# Patient Record
Sex: Male | Born: 1980 | Race: Black or African American | Hispanic: No | Marital: Single | Smoking: Current some day smoker
Health system: Southern US, Community
[De-identification: ages and names within clinical notes are randomized; demographics above are authoritative.]

## PROBLEM LIST (undated history)

## (undated) DIAGNOSIS — R12 Heartburn: Secondary | ICD-10-CM

## (undated) DIAGNOSIS — R569 Unspecified convulsions: Secondary | ICD-10-CM

## (undated) DIAGNOSIS — R011 Cardiac murmur, unspecified: Secondary | ICD-10-CM

## (undated) HISTORY — PX: HERNIA REPAIR: SHX51

## (undated) HISTORY — DX: Cardiac murmur, unspecified: R01.1

---

## 1999-03-24 ENCOUNTER — Emergency Department (HOSPITAL_COMMUNITY): Admission: EM | Admit: 1999-03-24 | Discharge: 1999-03-24 | Payer: Self-pay

## 2000-09-14 ENCOUNTER — Encounter: Payer: Self-pay | Admitting: Emergency Medicine

## 2000-09-14 ENCOUNTER — Emergency Department (HOSPITAL_COMMUNITY): Admission: EM | Admit: 2000-09-14 | Discharge: 2000-09-14 | Payer: Self-pay

## 2011-10-12 ENCOUNTER — Encounter: Payer: Self-pay | Admitting: Cardiology

## 2011-10-20 ENCOUNTER — Ambulatory Visit (INDEPENDENT_AMBULATORY_CARE_PROVIDER_SITE_OTHER): Payer: PRIVATE HEALTH INSURANCE | Admitting: Cardiology

## 2011-10-20 ENCOUNTER — Encounter: Payer: Self-pay | Admitting: Cardiology

## 2011-10-20 VITALS — BP 132/80 | HR 61 | Ht 66.0 in | Wt 135.0 lb

## 2011-10-20 DIAGNOSIS — R9431 Abnormal electrocardiogram [ECG] [EKG]: Secondary | ICD-10-CM

## 2011-10-20 NOTE — Assessment & Plan Note (Signed)
ECG chest findings are consistent with early repolarization which is a normal variant. This particularly prominent in young black males. He has no cardiac symptoms or history of cardiac disease. His exam is otherwise normal. No further evaluation is warranted in the patient is reassured.

## 2011-10-20 NOTE — Progress Notes (Signed)
   Jose Galvan Date of Birth: [birth year] Medical Record #[id number]  History of Present Illness: Jose Galvan is seen the request of his primary care physician for evaluation of abnormal ECG. He is a 31 year old [place] American male. He has been in excellent health. He denies any history of cardiac disease. He has no history of hypertension, hyperlipidemia, or diabetes. He reports a heart murmur noted as a child. He denies any chest pain, shortness of breath, palpitations, or dizziness. He tries to follow a heart healthy diet and quit smoking several years ago.  No current outpatient prescriptions on file prior to visit.    No Known Allergies  Past Medical History  Diagnosis Date  . Heart murmur     as a child    Past Surgical History  Procedure Date  . Hernia repair     at 11-42 years of age    History  Smoking status  . Former Smoker  . Types: Cigarettes  . Quit date: 10/19/2005  Smokeless tobacco  . Never Used    History  Alcohol Use: Not on file    Family History  Problem Relation Age of Onset  . Heart attack Father 36  . Coronary artery disease Father 52    with stent  . Stroke Sister 81  . Cancer Paternal Grandfather   . Cancer Maternal Grandmother   . Diabetes Mother   . Hypertension Sister   . Hypertension Sister   . Hypertension Mother   . Hypertension Sister     Review of Systems: The review of systems is positive for pain in his right elbow from a construction injury..  All other systems were reviewed and are negative.  Physical Exam: BP 132/80  Pulse 61  Ht 5\' 6"  (1.676 m)  Wt 135 lb (61.236 kg)  BMI 21.79 kg/m2 He is a pleasant, fit, black male in no acute distress. HEENT exam reveals he is normocephalic, atraumatic. Pupils are equal round and reactive. Sclera are clear. Oropharynx clear. Neck supple without JVD, adenopathy, thyromegaly, or bruits. Lungs are clear. Cardiac exam reveals a regular rate and rhythm. Normal S1 and S2. PMI is normal. No  gallop or murmur. Abdomen is soft and nontender without mass or bruits. Extremities are without edema. Pulses are 2+. Skin is warm and dry. Neurologic exam is nonfocal. LABORATORY DATA: Dated 08/12/2011 CBC is normal. Complete chemistry panel was normal. Total cholesterol 187, triglycerides 50, HDL 109, LDL 68. C-reactive protein 0.26. TSH 0.578. ECG dated 08/12/2011 and again today demonstrates normal sinus rhythm with a normal ECG with early repolarization..  Assessment / Plan:

## 2011-10-20 NOTE — Patient Instructions (Signed)
Your heart and Ecg are normal. No further work up needed.

## 2011-10-31 ENCOUNTER — Encounter: Payer: Self-pay | Admitting: Cardiology

## 2011-11-03 ENCOUNTER — Encounter: Payer: Self-pay | Admitting: Cardiology

## 2012-02-20 DIAGNOSIS — R569 Unspecified convulsions: Secondary | ICD-10-CM

## 2012-02-20 HISTORY — DX: Unspecified convulsions: R56.9

## 2012-03-20 ENCOUNTER — Emergency Department (HOSPITAL_COMMUNITY): Payer: 59

## 2012-03-20 ENCOUNTER — Other Ambulatory Visit: Payer: Self-pay

## 2012-03-20 ENCOUNTER — Emergency Department (HOSPITAL_COMMUNITY)
Admission: EM | Admit: 2012-03-20 | Discharge: 2012-03-20 | Disposition: A | Discharge status: Home / Self Care | Payer: 59 | Attending: Emergency Medicine | Admitting: Emergency Medicine

## 2012-03-20 DIAGNOSIS — R569 Unspecified convulsions: Secondary | ICD-10-CM | POA: Insufficient documentation

## 2012-03-20 DIAGNOSIS — Z87891 Personal history of nicotine dependence: Secondary | ICD-10-CM | POA: Insufficient documentation

## 2012-03-20 DIAGNOSIS — Z72820 Sleep deprivation: Secondary | ICD-10-CM | POA: Insufficient documentation

## 2012-03-20 DIAGNOSIS — Z8679 Personal history of other diseases of the circulatory system: Secondary | ICD-10-CM | POA: Insufficient documentation

## 2012-03-20 DIAGNOSIS — F1410 Cocaine abuse, uncomplicated: Secondary | ICD-10-CM | POA: Insufficient documentation

## 2012-03-20 MED ORDER — OXYCODONE-ACETAMINOPHEN 5-325 MG PO TABS
1.0000 | ORAL_TABLET | Freq: Once | ORAL | Status: AC
  Administered 2012-03-20: 1 via ORAL
  Filled 2012-03-20: qty 1

## 2012-03-20 NOTE — ED Notes (Signed)
Pt admits to smoking cannabis this am.

## 2012-03-20 NOTE — ED Notes (Signed)
Patient states he was walking to car and didn't feel well because he had not slept much. He does not remember falling just waking up on the ground. A friend witnessed the patient having a seizure. Seizure pads were placed on side rails of stretcher. Family at bedside.

## 2012-03-20 NOTE — ED Provider Notes (Addendum)
History     CSN: [id number]  Arrival date & time 03/20/12  4098   First MD Initiated Contact with Patient 03/20/12 2033      Chief Complaint  Patient presents with  . Seizures    (Consider location/radiation/quality/duration/timing/severity/associated sxs/prior treatment) The history is provided by the patient and the EMS personnel.   as reported that the patient had a grand mal seizure while at work.  The patient reports she has not been sleeping well over the past 2 nights.  He does admit to cocaine use as well.  He has no prior history of seizures.  He does have a cousin who has a seizure disorder.  The patient denies recent fevers or chills or neck pain.  At this time he reports no complaints except for mild right-sided headache.  He's noted over right parietal skull hematoma.  He denies weakness of his upper lower extremities.  Denies difficulty with speech.  No recent nausea vomiting or diarrhea.  Denies abdominal pain.  Past Medical History  Diagnosis Date  . Heart murmur     as a child    Past Surgical History  Procedure Date  . Hernia repair     at 1-72 years of age    Family History  Problem Relation Age of Onset  . Heart attack Father 3  . Coronary artery disease Father 10    with stent  . Stroke Sister 9  . Cancer Paternal Grandfather   . Cancer Maternal Grandmother   . Diabetes Mother   . Hypertension Sister   . Hypertension Sister   . Hypertension Mother   . Hypertension Sister     History  Substance Use Topics  . Smoking status: Former Smoker    Types: Cigarettes    Quit date: 10/19/2005  . Smokeless tobacco: Never Used  . Alcohol Use: Not on file      Review of Systems  Neurological: Positive for seizures.  All other systems reviewed and are negative.    Allergies  Review of patient's allergies indicates no known allergies.  Home Medications  No current outpatient prescriptions on file.  BP 139/80  Pulse 63  Temp 98.4 F (36.9  C) (Oral)  Resp 21  SpO2 98%  Physical Exam  Nursing note and vitals reviewed. Constitutional: He is oriented to person, place, and time. He appears well-developed and well-nourished.  HENT:       Hematoma with abrasion to right parietal skull.   Eyes: EOM are normal. Pupils are equal, round, and reactive to light.  Neck: Normal range of motion.       No C-spine tenderness.  C-spine cleared by Nexus criteria  Cardiovascular: Normal rate, regular rhythm, normal heart sounds and intact distal pulses.   Pulmonary/Chest: Effort normal and breath sounds normal. No respiratory distress.  Abdominal: Soft. He exhibits no distension. There is no tenderness.  Musculoskeletal: Normal range of motion.  Neurological: He is alert and oriented to person, place, and time.       5/5 strength in major muscle groups of  bilateral upper and lower extremities. Speech normal. No facial asymetry.   Skin: Skin is warm and dry.  Psychiatric: He has a normal mood and affect. Judgment normal.    ED Course  Procedures (including critical care time)   Labs Reviewed  GLUCOSE, CAPILLARY   Ct Head Wo Contrast  03/20/2012  *RADIOLOGY REPORT*  Clinical Data:  CT HEAD WITHOUT CONTRAST  Technique:  Contiguous axial images were obtained  from the base of the skull through the vertex without contrast.  Comparison: None.  Findings: No acute intracranial abnormality is present. Specifically, there is no evidence for acute infarct, hemorrhage, mass, hydrocephalus, or extra-axial fluid collection.  The paranasal sinuses and mastoid air cells are clear.  The globes and orbits are intact.  The osseous skull is intact.  IMPRESSION: Negative CT of the head.   Original Report Authenticated By: Jamesetta Orleans. MATTERN, M.D.     I personally reviewed the imaging tests through PACS system I reviewed available ER/hospitalization records thought the EMR   1. Seizure       MDM  New-onset seizure.  I suspect this is secondary  to sleep deprivation and cocaine abuse.  The patient's been recommended to stop using cocaine.  I told the patient to get better rest.  He will followup with neurology.  He will need an outpatient workup for this.  CT scan is normal.  His pain was treated in the ER.  No meningeal signs.  The patient was instructed to not drive swim take a bath or do anything else that would put himself or other people at risk until he is seen and evaluated by a neurologist        Lyanne Co, MD 03/21/12 0019   Date: 03/21/2012  Rate: 66  Rhythm: normal sinus rhythm  QRS Axis: normal  Intervals: normal  ST/T Wave abnormalities: normal  Conduction Disutrbances: none  Narrative Interpretation:   Old EKG Reviewed: No prior EKG available      Lyanne Co, MD 03/21/12 [phone]

## 2012-03-20 NOTE — ED Notes (Signed)
EMS-pt with grandmal seizure, no hx, pt was postictal on scene. Pt refused ccollar or LSB. 18g(L)hand. Pt with hematoma to right posterior scalp.

## 2012-03-20 NOTE — ED Notes (Signed)
Pt is aware of the urine sample needed, pt states they dont have to use the bathroom at this time

## 2012-12-09 ENCOUNTER — Ambulatory Visit: Payer: Self-pay | Admitting: Neurology

## 2013-03-03 IMAGING — CT CT HEAD W/O CM
1 series · 16 of 30 positions shown, 20 images · non-contrast
Comparison: None.

CLINICAL DATA: CT HEAD WITHOUT CONTRAST
TECHNIQUE: Contiguous axial images were obtained from the base of
the skull through the vertex without contrast.

[Series 2: head trauma 4.8 h37s · axial · 0.46mm/px · z∈[+1095,+1250]mm · 16 of 36 slices shown, 20 images]
[im 2/36  brain]
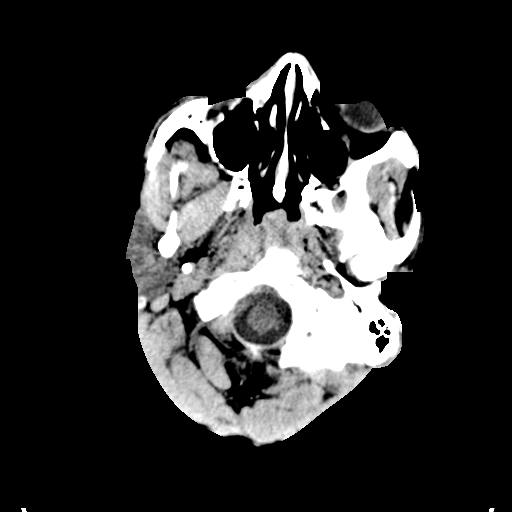
[im 2/36  bone]
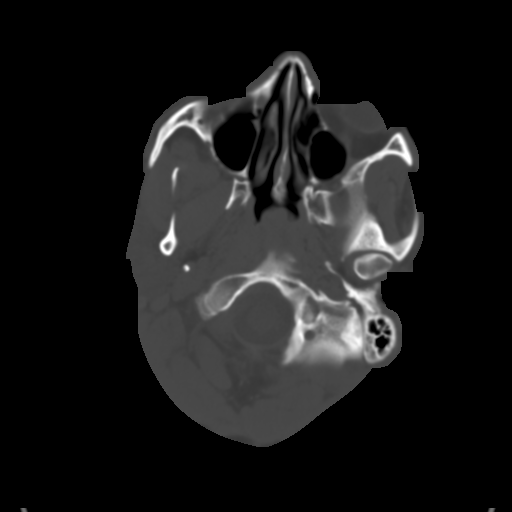
[im 4/36  brain]
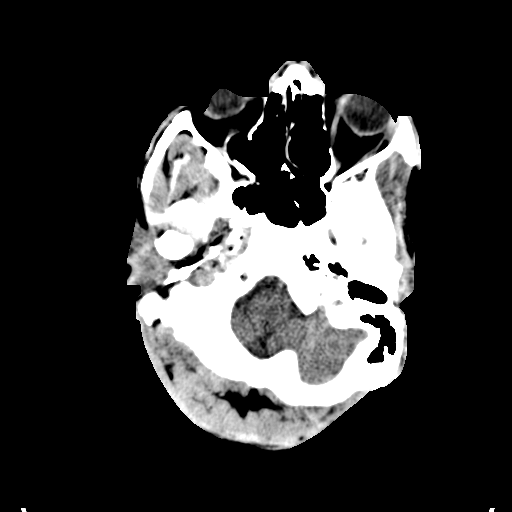
[im 7/36  brain]
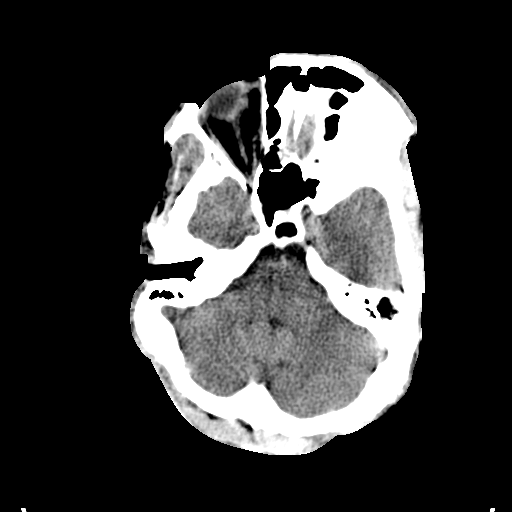
[im 9/36  brain]
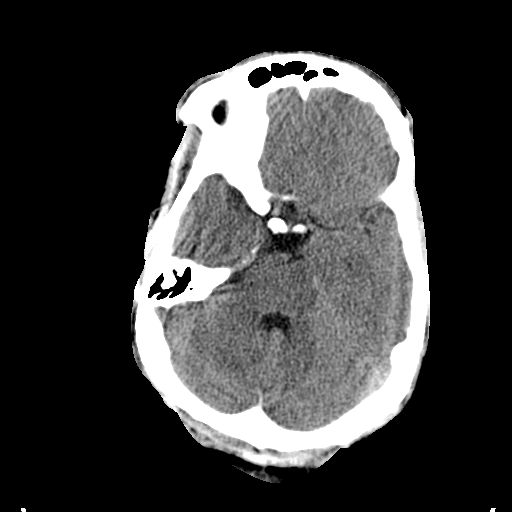
[im 10/36  brain]
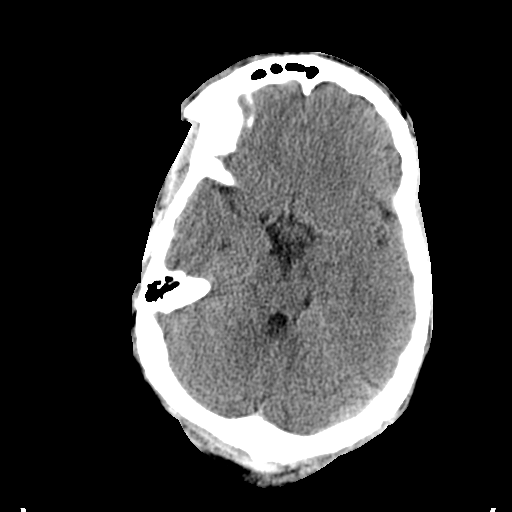
[im 10/36  bone]
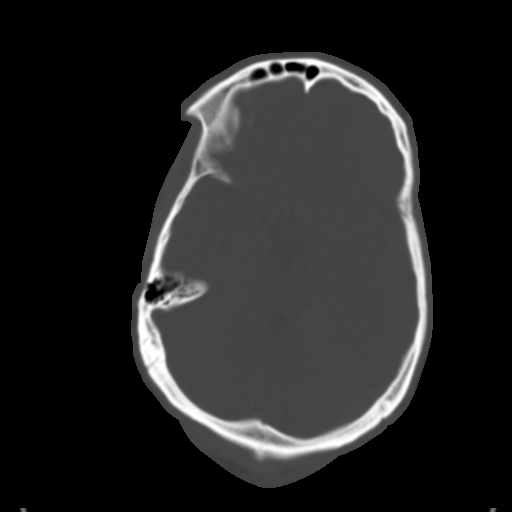
[im 13/36  brain]
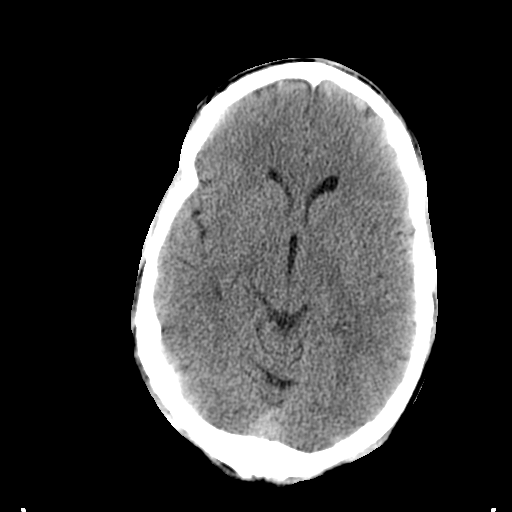
[im 15/36  brain]
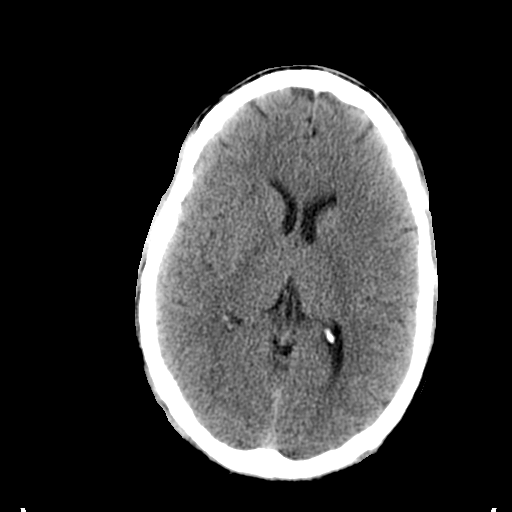
[im 17/36  brain]
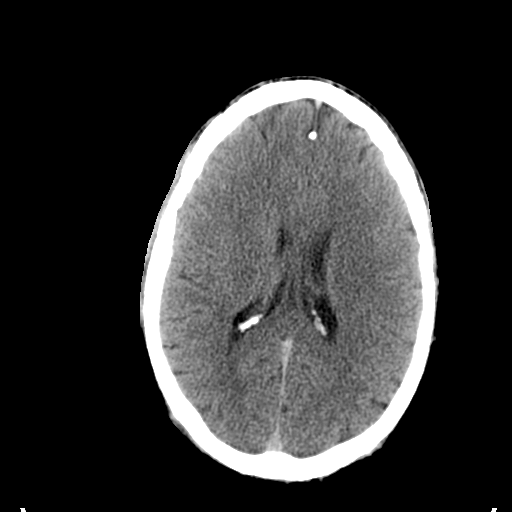
[im 19/36  brain]
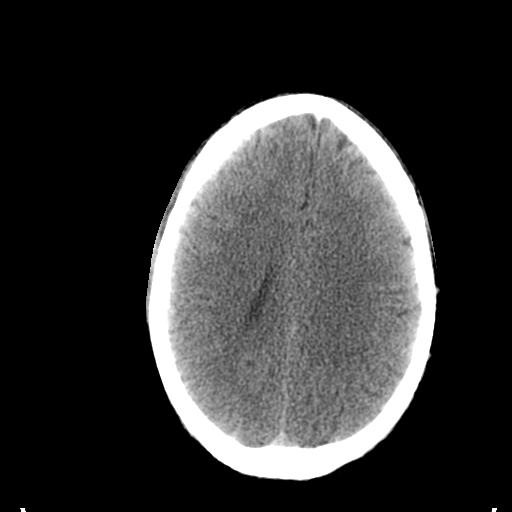
[im 19/36  bone]
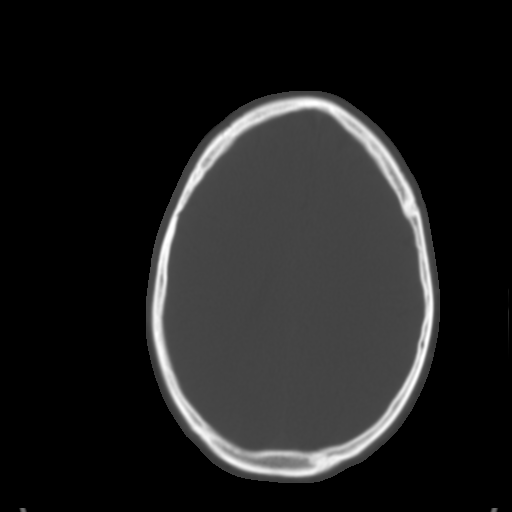
[im 21/36  brain]
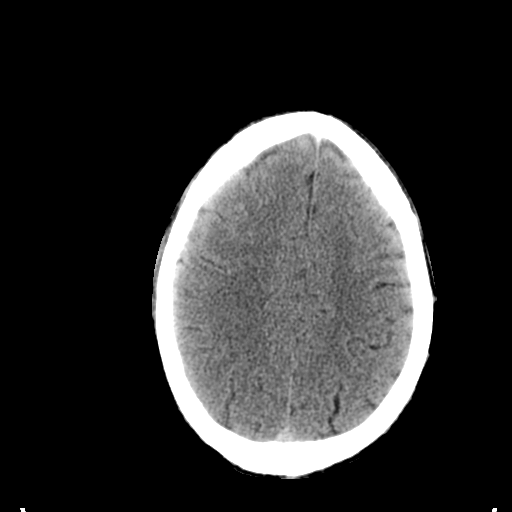
[im 23/36  brain]
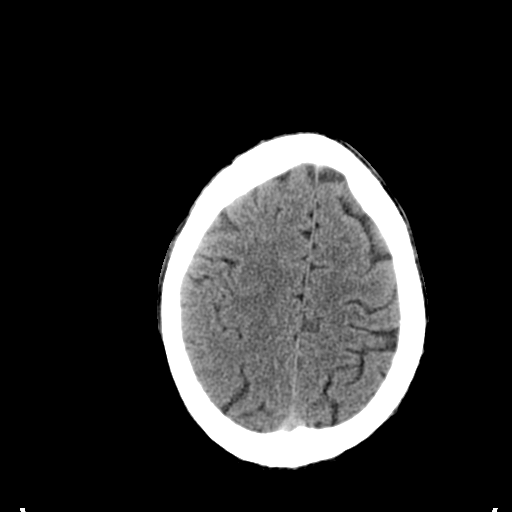
[im 26/36  brain]
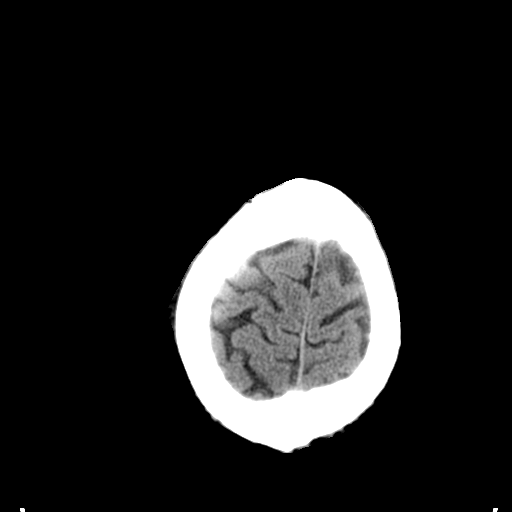
[im 27/36  brain]
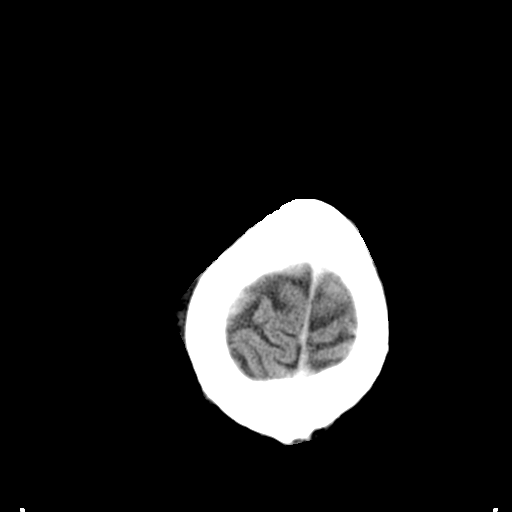
[im 27/36  bone]
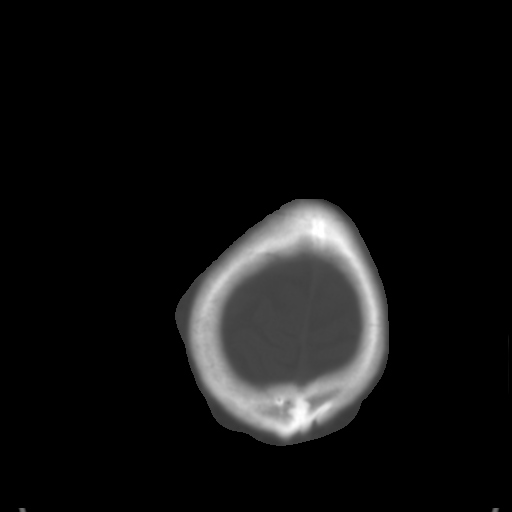
[im 29/36  brain]
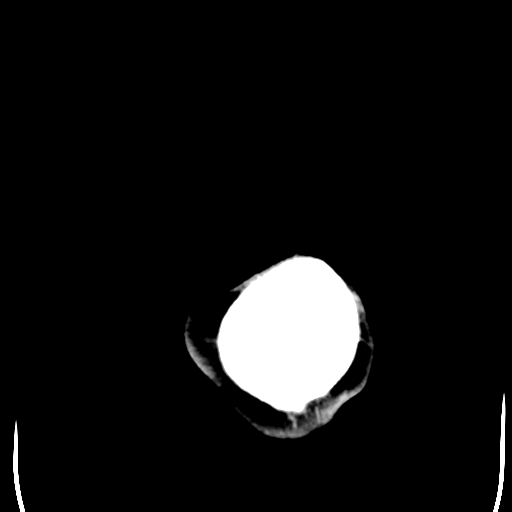
[im 32/36  brain]
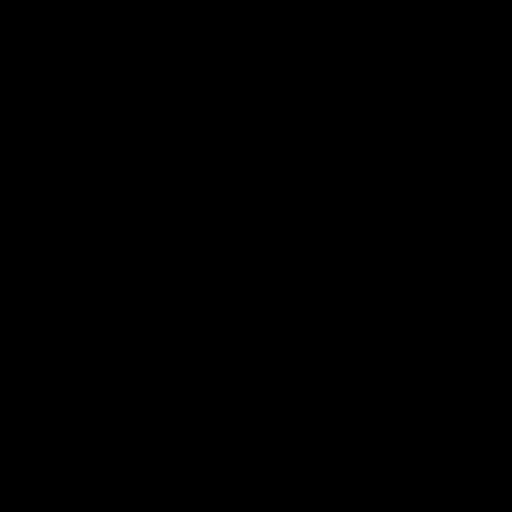
[im 34/36  brain]
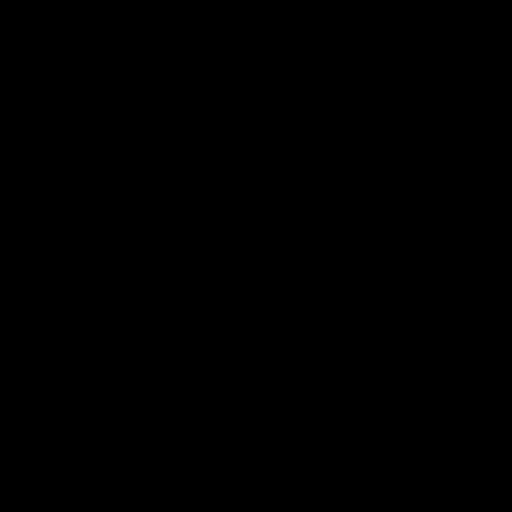

[16 of 30 positions shown; findings below may reference images not displayed]

FINDINGS: No acute intracranial abnormality is present.
Specifically, there is no evidence for acute infarct, hemorrhage,
mass, hydrocephalus, or extra-axial fluid collection.  The
paranasal sinuses and mastoid air cells are clear.  The globes and
orbits are intact.  The osseous skull is intact.
IMPRESSION: Negative CT of the head.

## 2013-06-06 ENCOUNTER — Encounter (HOSPITAL_COMMUNITY): Payer: Self-pay | Admitting: Emergency Medicine

## 2013-06-06 ENCOUNTER — Emergency Department (HOSPITAL_COMMUNITY)
Admission: EM | Admit: 2013-06-06 | Discharge: 2013-06-07 | Disposition: A | Discharge status: Home / Self Care | Payer: 59 | Attending: Emergency Medicine | Admitting: Emergency Medicine

## 2013-06-06 DIAGNOSIS — Z87891 Personal history of nicotine dependence: Secondary | ICD-10-CM | POA: Insufficient documentation

## 2013-06-06 DIAGNOSIS — S82843A Displaced bimalleolar fracture of unspecified lower leg, initial encounter for closed fracture: Secondary | ICD-10-CM | POA: Insufficient documentation

## 2013-06-06 DIAGNOSIS — T71163A Asphyxiation due to hanging, assault, initial encounter: Secondary | ICD-10-CM | POA: Insufficient documentation

## 2013-06-06 DIAGNOSIS — S51809A Unspecified open wound of unspecified forearm, initial encounter: Secondary | ICD-10-CM | POA: Insufficient documentation

## 2013-06-06 DIAGNOSIS — T148XXA Other injury of unspecified body region, initial encounter: Secondary | ICD-10-CM

## 2013-06-06 DIAGNOSIS — R011 Cardiac murmur, unspecified: Secondary | ICD-10-CM | POA: Insufficient documentation

## 2013-06-06 DIAGNOSIS — S9000XA Contusion of unspecified ankle, initial encounter: Secondary | ICD-10-CM | POA: Insufficient documentation

## 2013-06-06 DIAGNOSIS — Z23 Encounter for immunization: Secondary | ICD-10-CM | POA: Insufficient documentation

## 2013-06-06 DIAGNOSIS — IMO0002 Reserved for concepts with insufficient information to code with codable children: Secondary | ICD-10-CM | POA: Insufficient documentation

## 2013-06-06 MED ORDER — SODIUM CHLORIDE 0.9 % IV BOLUS (SEPSIS)
1000.0000 mL | Freq: Once | INTRAVENOUS | Status: AC
  Administered 2013-06-07: 1000 mL via INTRAVENOUS

## 2013-06-06 NOTE — ED Notes (Signed)
[employer] at bedside.
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
## 2013-06-06 NOTE — ED Notes (Signed)
EDP to bedside. 

## 2013-06-07 ENCOUNTER — Emergency Department (HOSPITAL_COMMUNITY): Payer: Self-pay

## 2013-06-07 ENCOUNTER — Emergency Department (HOSPITAL_COMMUNITY): Payer: 59

## 2013-06-07 LAB — ETHANOL: Alcohol, Ethyl (B): 122 mg/dL — ABNORMAL HIGH (ref 0–11)

## 2013-06-07 LAB — URINALYSIS, ROUTINE W REFLEX MICROSCOPIC
Bilirubin Urine: NEGATIVE
GLUCOSE, UA: NEGATIVE mg/dL
Ketones, ur: NEGATIVE mg/dL
LEUKOCYTES UA: NEGATIVE
Nitrite: NEGATIVE
PH: 6 (ref 5.0–8.0)
PROTEIN: NEGATIVE mg/dL
SPECIFIC GRAVITY, URINE: 1.006 (ref 1.005–1.030)
Urobilinogen, UA: 0.2 mg/dL (ref 0.0–1.0)

## 2013-06-07 LAB — CBC WITH DIFFERENTIAL/PLATELET
BASOS PCT: 1 % (ref 0–1)
Basophils Absolute: 0.2 10*3/uL — ABNORMAL HIGH (ref 0.0–0.1)
EOS ABS: 0.4 10*3/uL (ref 0.0–0.7)
EOS PCT: 2 % (ref 0–5)
HEMATOCRIT: 42.8 % (ref 39.0–52.0)
HEMOGLOBIN: 15.4 g/dL (ref 13.0–17.0)
LYMPHS PCT: 25 % (ref 12–46)
Lymphs Abs: 4.8 10*3/uL — ABNORMAL HIGH (ref 0.7–4.0)
MCH: 33.6 pg (ref 26.0–34.0)
MCHC: 36 g/dL (ref 30.0–36.0)
MCV: 93.2 fL (ref 78.0–100.0)
Monocytes Absolute: 1.3 10*3/uL — ABNORMAL HIGH (ref 0.1–1.0)
Monocytes Relative: 7 % (ref 3–12)
NEUTROS ABS: 12.5 10*3/uL — AB (ref 1.7–7.7)
Neutrophils Relative %: 65 % (ref 43–77)
Platelets: 315 10*3/uL (ref 150–400)
RBC: 4.59 MIL/uL (ref 4.22–5.81)
RDW: 13 % (ref 11.5–15.5)
WBC: 19.2 10*3/uL — ABNORMAL HIGH (ref 4.0–10.5)

## 2013-06-07 LAB — BASIC METABOLIC PANEL
BUN: 10 mg/dL (ref 6–23)
CO2: 16 meq/L — AB (ref 19–32)
Calcium: 9.2 mg/dL (ref 8.4–10.5)
Chloride: 97 mEq/L (ref 96–112)
Creatinine, Ser: 1.05 mg/dL (ref 0.50–1.35)
GFR calc Af Amer: >90 mL/min (ref >90)
GLUCOSE: 127 mg/dL — AB (ref 70–99)
POTASSIUM: 3.7 meq/L (ref 3.7–5.3)
Sodium: 137 mEq/L (ref 137–147)

## 2013-06-07 LAB — RAPID URINE DRUG SCREEN, HOSP PERFORMED
AMPHETAMINES: NOT DETECTED
BENZODIAZEPINES: NOT DETECTED
Barbiturates: NOT DETECTED
COCAINE: NOT DETECTED
OPIATES: NOT DETECTED
TETRAHYDROCANNABINOL: POSITIVE — AB

## 2013-06-07 LAB — URINE MICROSCOPIC-ADD ON

## 2013-06-07 MED ORDER — OXYCODONE-ACETAMINOPHEN 5-325 MG PO TABS
2.0000 | ORAL_TABLET | Freq: Once | ORAL | Status: AC
Start: 2013-06-07 — End: 2013-06-07
  Administered 2013-06-07: 2 via ORAL
  Filled 2013-06-07: qty 2

## 2013-06-07 MED ORDER — HYDROCODONE-ACETAMINOPHEN 5-325 MG PO TABS: 1.0000 | ORAL_TABLET | Freq: Four times a day (QID) | ORAL | Status: DC | PRN

## 2013-06-07 MED ORDER — TETANUS-DIPHTH-ACELL PERTUSSIS 5-2.5-18.5 LF-MCG/0.5 IM SUSP
0.5000 mL | Freq: Once | INTRAMUSCULAR | Status: AC
  Administered 2013-06-07: 0.5 mL via INTRAMUSCULAR
  Filled 2013-06-07: qty 0.5

## 2013-06-07 MED ORDER — IBUPROFEN 400 MG PO TABS: 400.0000 mg | ORAL_TABLET | Freq: Four times a day (QID) | ORAL | Status: DC | PRN

## 2013-06-07 MED ORDER — BACITRACIN-NEOMYCIN-POLYMYXIN 400-5-5000 EX OINT: 1.0000 "application " | TOPICAL_OINTMENT | Freq: Two times a day (BID) | CUTANEOUS | Status: AC

## 2013-06-07 NOTE — Discharge Instructions (Signed)
No weight on your left ankle at all. Ice and elevation for swelling. Call [phone], [facility]/Dr. Magnus IvanBlackman with questions/concerns We will call you with information and dates for your ankle surgery. Surgery will be scheduled for late next week and you will stay overnight at the hospital after surgery.   Abrasion An abrasion is a cut or scrape of the skin. Abrasions do not extend through all layers of the skin and most heal within 10 days. It is important to care for your abrasion properly to prevent infection. CAUSES  Most abrasions are caused by falling on, or gliding across, the ground or other surface. When your skin rubs on something, the outer and inner layer of skin rubs off, causing an abrasion. DIAGNOSIS  Your caregiver will be able to diagnose an abrasion during a physical exam.  TREATMENT  Your treatment depends on how large and deep the abrasion is. Generally, your abrasion will be cleaned with water and a mild soap to remove any dirt or debris. An antibiotic ointment may be put over the abrasion to prevent an infection. A bandage (dressing) may be wrapped around the abrasion to keep it from getting dirty.  You may need a tetanus shot if:  You cannot remember when you had your last tetanus shot.  You have never had a tetanus shot.  The injury broke your skin. If you get a tetanus shot, your arm may swell, get red, and feel warm to the touch. This is common and not a problem. If you need a tetanus shot and you choose not to have one, there is a rare chance of getting tetanus. Sickness from tetanus can be serious.  HOME CARE INSTRUCTIONS   If a dressing was applied, change it at least once a day or as directed by your caregiver. If the bandage sticks, soak it off with warm water.   Wash the area with water and a mild soap to remove all the ointment 2 times a day. Rinse off the soap and pat the area dry with a clean towel.   Reapply any ointment as directed by your  caregiver. This will help prevent infection and keep the bandage from sticking. Use gauze over the wound and under the dressing to help keep the bandage from sticking.   Change your dressing right away if it becomes wet or dirty.   Only take over-the-counter or prescription medicines for pain, discomfort, or fever as directed by your caregiver.   Follow up with your caregiver within 24 48 hours for a wound check, or as directed. If you were not given a wound-check appointment, look closely at your abrasion for redness, swelling, or pus. These are signs of infection. SEEK IMMEDIATE MEDICAL CARE IF:   You have increasing pain in the wound.   You have redness, swelling, or tenderness around the wound.   You have pus coming from the wound.   You have a fever or persistent symptoms for more than 2 3 days.  You have a fever and your symptoms suddenly get worse.  You have a bad smell coming from the wound or dressing.  MAKE SURE YOU:   Understand these instructions.  Will watch your condition.  Will get help right away if you are not doing well or get worse. Document Released: 02/15/2005 Document Revised: 04/24/2012 Document Reviewed: 04/11/2011 [facility] Patient Information 2014 [employer], [place].   Bimalleolar Fracture, Ankle, Adult, Displaced (ORIF), Care After Read the instructions outlined below and refer to this sheet in  the next few weeks. These discharge instructions provide you with general information on caring for yourself after you leave the hospital. Your doctor may also give you specific instructions. While your treatment has been planned according to the most current medical practices available, unavoidable complications occasionally occur. If you have any problems or questions after discharge, please call your caregiver. HOME CARE INSTRUCTIONS  You may resume normal diet and activities as directed or allowed. Use crutches as instructed.  Keep ice packs (a bag of ice  wrapped in a towel) on the surgical area for 15-20 minutes, 03-04 times per day, for the first two days following surgery. Use the ice only if OK with your surgeon or caregiver.  Change dressings if necessary or as directed.  If you have a plaster or fiberglass splint or cast:  Do not try to scratch the skin under the cast using sharp or pointed objects.  Check the skin around the cast every day. You may put lotion on any red or sore areas.  Keep your cast or splint dry and clean.  Do not put pressure on any part of your cast or splint until it is fully hardened.  Your cast or splint can be protected during bathing with a plastic bag. Do not lower the cast or splint into water.  Take prescribed medication as directed. Only take over-the-counter or prescription medicines for pain, discomfort, or fever as directed by your caregiver.  Use crutches as directed and do not exercise leg unless instructed.  These are not fractures to be taken lightly! If the fracture displaces and gets out of position, it may eventually lead to arthritis and disability for the rest of your life. Problems often follow even the best of care.  Follow all instructions given to you by your caregiver, make and keep follow up appointments. SEEK IMMEDIATE MEDICAL CARE IF:  You develop redness, swelling, numbness or increasing pain in the wound.  There is pus coming from the wound.  An unexplained oral temperature above 102 F (38.9 C) develops.  A bad smell is coming from the wound or dressing.  A breaking open of the wound (edges not staying together) occurs after stitches or staples have been removed. If you do not have a window in your cast for observing the wound, a discharge or minor bleeding may show up as a stain on the outside of your cast immediately after surgery. Report these findings to your caregiver. Document Released: 11/25/2004 Document Revised: 02/26/2013 Document Reviewed: 11/17/2008 [facility]  Patient Information 2014 [place], [place].
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
## 2013-06-07 NOTE — ED Notes (Signed)
Ortho MD at bedside.

## 2013-06-07 NOTE — ED Notes (Signed)
Ortho MD at bedside. MD asked for Lidocaine to set the pt's left ankle, however, completed reducing the fracture before this RN came back to the room. Ortho tech still needed to place a cast on the pt's left ankle.
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
## 2013-06-09 ENCOUNTER — Other Ambulatory Visit (HOSPITAL_COMMUNITY): Payer: Self-pay | Admitting: Orthopaedic Surgery

## 2013-06-09 NOTE — Patient Instructions (Addendum)
20 Sharmon LeydenKevin L Mclester  06/09/2013   Your procedure is scheduled on:  06/13/13 FRIDAY  Report to [facility] at  1015     AM.  Call this number if you have problems the morning of surgery: [phone]       Remember:   Do not eat food :After Midnight.THURSDAY NIGHT-- MAY HAVE CLEAR LIQUIDS Friday MORNING UNTIL 0645am, THEN NOTHING BY MOUTH   Take these medicines the morning of surgery with A SIP OF WATER: NORCO NO RECREATIONAL DRUG USAGE  .  Contacts, dentures or partial plates can not be worn to surgery  Leave suitcase in the car. After surgery it may be brought to your room.  For patients admitted to the hospital, checkout time is 11:00 AM day of  discharge.             SPECIAL INSTRUCTIONS- SEE [facility] PREPARING FOR SURGERY INSTRUCTION SHEET-     DO NOT WEAR JEWELRY, LOTIONS, POWDERS, OR PERFUMES.  WOMEN-- DO NOT SHAVE LEGS OR UNDERARMS FOR 12 HOURS BEFORE SHOWERS. MEN MAY SHAVE FACE.  Patients discharged the day of surgery will not be allowed to drive home. IF going home the day of surgery, you must have a driver and someone to stay with you for the first 24 hours  Name and phone number of your driver:         Overnight stay                                                                 Miquan Tandon  PST 336  [phone]                 FAILURE TO FOLLOW THESE INSTRUCTIONS MAY RESULT IN  CANCELLATION   OF YOUR SURGERY                                                  Patient Signature _____________________________

## 2013-06-09 NOTE — Progress Notes (Signed)
BMET, urine and urine drug screen, 1 view chest 06/06/13 EPIC

## 2013-06-10 ENCOUNTER — Encounter (HOSPITAL_COMMUNITY): Payer: Self-pay

## 2013-06-10 ENCOUNTER — Encounter (HOSPITAL_COMMUNITY): Payer: Self-pay | Admitting: Pharmacy Technician

## 2013-06-10 ENCOUNTER — Encounter (HOSPITAL_COMMUNITY)
Admission: RE | Admit: 2013-06-10 | Discharge: 2013-06-10 | Disposition: A | Discharge status: Home / Self Care | Payer: Self-pay | Source: Ambulatory Visit | Attending: Orthopaedic Surgery | Admitting: Orthopaedic Surgery

## 2013-06-10 DIAGNOSIS — Z01812 Encounter for preprocedural laboratory examination: Secondary | ICD-10-CM

## 2013-06-10 HISTORY — DX: Heartburn: R12

## 2013-06-10 HISTORY — DX: Unspecified convulsions: R56.9

## 2013-06-10 LAB — COMPREHENSIVE METABOLIC PANEL
ALT: 10 U/L (ref 0–53)
AST: 17 U/L (ref 0–37)
Albumin: 3.8 g/dL (ref 3.5–5.2)
Alkaline Phosphatase: 51 U/L (ref 39–117)
BUN: 7 mg/dL (ref 6–23)
CALCIUM: 9.8 mg/dL (ref 8.4–10.5)
CO2: 28 mEq/L (ref 19–32)
Chloride: 98 mEq/L (ref 96–112)
Creatinine, Ser: 0.8 mg/dL (ref 0.50–1.35)
Glucose, Bld: 110 mg/dL — ABNORMAL HIGH (ref 70–99)
Potassium: 4.4 mEq/L (ref 3.7–5.3)
SODIUM: 139 meq/L (ref 137–147)
TOTAL PROTEIN: 8 g/dL (ref 6.0–8.3)
Total Bilirubin: 0.5 mg/dL (ref 0.3–1.2)

## 2013-06-10 LAB — CBC
HCT: 43.3 % (ref 39.0–52.0)
Hemoglobin: 15.2 g/dL (ref 13.0–17.0)
MCH: 32.4 pg (ref 26.0–34.0)
MCHC: 35.1 g/dL (ref 30.0–36.0)
MCV: 92.3 fL (ref 78.0–100.0)
PLATELETS: 300 10*3/uL (ref 150–400)
RBC: 4.69 MIL/uL (ref 4.22–5.81)
RDW: 12.9 % (ref 11.5–15.5)
WBC: 15 10*3/uL — ABNORMAL HIGH (ref 4.0–10.5)
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
## 2013-06-11 NOTE — Progress Notes (Signed)
Left message pts cell phone to call me regarding surgery time change.  Spoke with sister Kisa(she accompanied pt at PST visit, gave me phone number as contact)  Informed her to have Caryn BeeKevin at short stay at [facility]0850am Friday AM. States will tell him

## 2013-06-13 ENCOUNTER — Encounter (HOSPITAL_COMMUNITY): Payer: No Typology Code available for payment source | Admitting: Anesthesiology

## 2013-06-13 ENCOUNTER — Encounter (HOSPITAL_COMMUNITY)
Admission: RE | Disposition: A | Discharge status: Home / Self Care | Payer: Self-pay | Source: Ambulatory Visit | Attending: Orthopaedic Surgery

## 2013-06-13 ENCOUNTER — Encounter (HOSPITAL_COMMUNITY): Payer: Self-pay | Admitting: *Deleted

## 2013-06-13 ENCOUNTER — Ambulatory Visit (HOSPITAL_COMMUNITY)
Admission: RE | Admit: 2013-06-13 | Discharge: 2013-06-13 | Disposition: A | Discharge status: Home / Self Care | Payer: No Typology Code available for payment source | Source: Ambulatory Visit | Admitting: Orthopaedic Surgery

## 2013-06-13 ENCOUNTER — Observation Stay (HOSPITAL_COMMUNITY)
Admission: RE | Admit: 2013-06-13 | Discharge: 2013-06-14 | Disposition: A | Discharge status: Home / Self Care | Payer: Self-pay | Source: Ambulatory Visit | Attending: Orthopaedic Surgery | Admitting: Orthopaedic Surgery

## 2013-06-13 ENCOUNTER — Ambulatory Visit (HOSPITAL_COMMUNITY): Payer: Self-pay | Admitting: Anesthesiology

## 2013-06-13 ENCOUNTER — Ambulatory Visit (HOSPITAL_COMMUNITY): Payer: Self-pay

## 2013-06-13 DIAGNOSIS — S0003XA Contusion of scalp, initial encounter: Secondary | ICD-10-CM | POA: Insufficient documentation

## 2013-06-13 DIAGNOSIS — R220 Localized swelling, mass and lump, head: Secondary | ICD-10-CM | POA: Insufficient documentation

## 2013-06-13 DIAGNOSIS — S82843A Displaced bimalleolar fracture of unspecified lower leg, initial encounter for closed fracture: Principal | ICD-10-CM | POA: Insufficient documentation

## 2013-06-13 DIAGNOSIS — IMO0002 Reserved for concepts with insufficient information to code with codable children: Secondary | ICD-10-CM | POA: Insufficient documentation

## 2013-06-13 DIAGNOSIS — Z87891 Personal history of nicotine dependence: Secondary | ICD-10-CM | POA: Insufficient documentation

## 2013-06-13 DIAGNOSIS — R569 Unspecified convulsions: Secondary | ICD-10-CM | POA: Insufficient documentation

## 2013-06-13 DIAGNOSIS — S1093XA Contusion of unspecified part of neck, initial encounter: Secondary | ICD-10-CM

## 2013-06-13 DIAGNOSIS — S82892A Other fracture of left lower leg, initial encounter for closed fracture: Secondary | ICD-10-CM

## 2013-06-13 DIAGNOSIS — S0083XA Contusion of other part of head, initial encounter: Secondary | ICD-10-CM

## 2013-06-13 DIAGNOSIS — R221 Localized swelling, mass and lump, neck: Secondary | ICD-10-CM

## 2013-06-13 DIAGNOSIS — S82842A Displaced bimalleolar fracture of left lower leg, initial encounter for closed fracture: Secondary | ICD-10-CM

## 2013-06-13 HISTORY — PX: ORIF ANKLE FRACTURE: SHX5408

## 2013-06-13 LAB — RAPID URINE DRUG SCREEN, HOSP PERFORMED
Amphetamines: NOT DETECTED
BARBITURATES: NOT DETECTED
BENZODIAZEPINES: NOT DETECTED
COCAINE: NOT DETECTED
Opiates: NOT DETECTED
Tetrahydrocannabinol: POSITIVE — AB

## 2013-06-13 SURGERY — OPEN REDUCTION INTERNAL FIXATION (ORIF) ANKLE FRACTURE
Anesthesia: General | Laterality: Left

## 2013-06-13 MED ORDER — HYDROMORPHONE HCL PF 1 MG/ML IJ SOLN
INTRAMUSCULAR | Status: DC | PRN
  Administered 2013-06-13 (×2): 0.5 mg via INTRAVENOUS
  Administered 2013-06-13: 1 mg via INTRAVENOUS

## 2013-06-13 MED ORDER — BUPIVACAINE HCL (PF) 0.5 % IJ SOLN
INTRAMUSCULAR | Status: DC | PRN
  Administered 2013-06-13: 20 mL

## 2013-06-13 MED ORDER — METOCLOPRAMIDE HCL 5 MG/ML IJ SOLN: 5.0000 mg | Freq: Three times a day (TID) | INTRAMUSCULAR | Status: DC | PRN

## 2013-06-13 MED ORDER — ONDANSETRON HCL 4 MG/2ML IJ SOLN: 4.0000 mg | Freq: Four times a day (QID) | INTRAMUSCULAR | Status: DC | PRN

## 2013-06-13 MED ORDER — ASPIRIN 325 MG PO TABS: 325.0000 mg | ORAL_TABLET | Freq: Two times a day (BID) | ORAL | Status: AC

## 2013-06-13 MED ORDER — BUPIVACAINE HCL (PF) 0.25 % IJ SOLN
INTRAMUSCULAR | Status: AC
  Filled 2013-06-13: qty 30

## 2013-06-13 MED ORDER — FENTANYL CITRATE 0.05 MG/ML IJ SOLN
INTRAMUSCULAR | Status: AC
  Filled 2013-06-13: qty 2

## 2013-06-13 MED ORDER — HYDROMORPHONE HCL PF 1 MG/ML IJ SOLN
INTRAMUSCULAR | Status: AC
  Filled 2013-06-13: qty 1

## 2013-06-13 MED ORDER — OXYCODONE-ACETAMINOPHEN 5-325 MG PO TABS: 1.0000 | ORAL_TABLET | ORAL | Status: AC | PRN

## 2013-06-13 MED ORDER — CEFAZOLIN SODIUM-DEXTROSE 2-3 GM-% IV SOLR
2.0000 g | INTRAVENOUS | Status: AC
  Administered 2013-06-13: 2 g via INTRAVENOUS

## 2013-06-13 MED ORDER — DEXAMETHASONE SODIUM PHOSPHATE 10 MG/ML IJ SOLN
INTRAMUSCULAR | Status: AC
  Filled 2013-06-13: qty 1

## 2013-06-13 MED ORDER — LIDOCAINE HCL (CARDIAC) 20 MG/ML IV SOLN
INTRAVENOUS | Status: AC
  Filled 2013-06-13: qty 5

## 2013-06-13 MED ORDER — CEFAZOLIN SODIUM-DEXTROSE 2-3 GM-% IV SOLR
INTRAVENOUS | Status: AC
  Filled 2013-06-13: qty 50

## 2013-06-13 MED ORDER — METOCLOPRAMIDE HCL 10 MG PO TABS: 5.0000 mg | ORAL_TABLET | Freq: Three times a day (TID) | ORAL | Status: DC | PRN

## 2013-06-13 MED ORDER — ONDANSETRON HCL 4 MG/2ML IJ SOLN
INTRAMUSCULAR | Status: AC
  Filled 2013-06-13: qty 2

## 2013-06-13 MED ORDER — FENTANYL CITRATE 0.05 MG/ML IJ SOLN
INTRAMUSCULAR | Status: AC
  Filled 2013-06-13: qty 5

## 2013-06-13 MED ORDER — ZOLPIDEM TARTRATE 5 MG PO TABS
5.0000 mg | ORAL_TABLET | Freq: Every evening | ORAL | Status: DC | PRN
  Administered 2013-06-13: 5 mg via ORAL
  Filled 2013-06-13: qty 1

## 2013-06-13 MED ORDER — ONDANSETRON HCL 4 MG PO TABS: 4.0000 mg | ORAL_TABLET | Freq: Four times a day (QID) | ORAL | Status: DC | PRN

## 2013-06-13 MED ORDER — ONDANSETRON HCL 4 MG/2ML IJ SOLN
INTRAMUSCULAR | Status: DC | PRN
  Administered 2013-06-13: 4 mg via INTRAVENOUS

## 2013-06-13 MED ORDER — LIDOCAINE HCL (CARDIAC) 20 MG/ML IV SOLN
INTRAVENOUS | Status: DC | PRN
  Administered 2013-06-13: 100 mg via INTRAVENOUS

## 2013-06-13 MED ORDER — SODIUM CHLORIDE 0.9 % IV SOLN
INTRAVENOUS | Status: DC
  Administered 2013-06-13: 21:00:00 via INTRAVENOUS

## 2013-06-13 MED ORDER — DOCUSATE SODIUM 100 MG PO CAPS
100.0000 mg | ORAL_CAPSULE | Freq: Two times a day (BID) | ORAL | Status: DC
  Administered 2013-06-13 – 2013-06-14 (×2): 100 mg via ORAL

## 2013-06-13 MED ORDER — MORPHINE SULFATE 2 MG/ML IJ SOLN: 1.0000 mg | INTRAMUSCULAR | Status: DC | PRN

## 2013-06-13 MED ORDER — OXYCODONE HCL 5 MG PO TABS
5.0000 mg | ORAL_TABLET | ORAL | Status: DC | PRN
  Administered 2013-06-13 – 2013-06-14 (×6): 10 mg via ORAL
  Filled 2013-06-13 (×7): qty 2

## 2013-06-13 MED ORDER — HYDROMORPHONE HCL PF 1 MG/ML IJ SOLN
1.0000 mg | INTRAMUSCULAR | Status: DC | PRN
  Administered 2013-06-13 – 2013-06-14 (×3): 1 mg via INTRAVENOUS
  Filled 2013-06-13 (×3): qty 1

## 2013-06-13 MED ORDER — DEXAMETHASONE SODIUM PHOSPHATE 10 MG/ML IJ SOLN
INTRAMUSCULAR | Status: DC | PRN
  Administered 2013-06-13: 10 mg via INTRAVENOUS

## 2013-06-13 MED ORDER — HYDROCODONE-ACETAMINOPHEN 5-325 MG PO TABS: 1.0000 | ORAL_TABLET | ORAL | Status: DC | PRN

## 2013-06-13 MED ORDER — ACETAMINOPHEN 10 MG/ML IV SOLN
1000.0000 mg | Freq: Once | INTRAVENOUS | Status: AC
  Administered 2013-06-13: 1000 mg via INTRAVENOUS
  Filled 2013-06-13: qty 100

## 2013-06-13 MED ORDER — PROPOFOL 10 MG/ML IV BOLUS
INTRAVENOUS | Status: AC
  Filled 2013-06-13: qty 20

## 2013-06-13 MED ORDER — METHOCARBAMOL 500 MG PO TABS
500.0000 mg | ORAL_TABLET | Freq: Four times a day (QID) | ORAL | Status: DC | PRN
  Administered 2013-06-13 – 2013-06-14 (×3): 500 mg via ORAL
  Filled 2013-06-13 (×5): qty 1

## 2013-06-13 MED ORDER — MIDAZOLAM HCL 5 MG/5ML IJ SOLN
INTRAMUSCULAR | Status: DC | PRN
  Administered 2013-06-13: 2 mg via INTRAVENOUS

## 2013-06-13 MED ORDER — LACTATED RINGERS IV SOLN
INTRAVENOUS | Status: DC
  Administered 2013-06-13: 1000 mL via INTRAVENOUS
  Administered 2013-06-13: 13:00:00 via INTRAVENOUS

## 2013-06-13 MED ORDER — PROPOFOL 10 MG/ML IV BOLUS
INTRAVENOUS | Status: DC | PRN
  Administered 2013-06-13: 200 mg via INTRAVENOUS
  Administered 2013-06-13: 100 mg via INTRAVENOUS

## 2013-06-13 MED ORDER — BUPIVACAINE LIPOSOME 1.3 % IJ SUSP
20.0000 mL | Freq: Once | INTRAMUSCULAR | Status: DC
  Filled 2013-06-13: qty 20

## 2013-06-13 MED ORDER — DIPHENHYDRAMINE HCL 12.5 MG/5ML PO ELIX: 12.5000 mg | ORAL_SOLUTION | ORAL | Status: DC | PRN

## 2013-06-13 MED ORDER — FENTANYL CITRATE 0.05 MG/ML IJ SOLN
INTRAMUSCULAR | Status: DC | PRN
  Administered 2013-06-13: 50 ug via INTRAVENOUS
  Administered 2013-06-13: 25 ug via INTRAVENOUS
  Administered 2013-06-13: 50 ug via INTRAVENOUS
  Administered 2013-06-13: 75 ug via INTRAVENOUS
  Administered 2013-06-13 (×4): 50 ug via INTRAVENOUS

## 2013-06-13 MED ORDER — CEFAZOLIN SODIUM 1-5 GM-% IV SOLN
1.0000 g | Freq: Four times a day (QID) | INTRAVENOUS | Status: AC
  Administered 2013-06-13 – 2013-06-14 (×3): 1 g via INTRAVENOUS
  Filled 2013-06-13 (×3): qty 50

## 2013-06-13 MED ORDER — LACTATED RINGERS IV SOLN: INTRAVENOUS | Status: DC

## 2013-06-13 MED ORDER — METHOCARBAMOL 100 MG/ML IJ SOLN
500.0000 mg | Freq: Four times a day (QID) | INTRAVENOUS | Status: DC | PRN
  Administered 2013-06-13: 500 mg via INTRAVENOUS
  Filled 2013-06-13: qty 5

## 2013-06-13 MED ORDER — HYDROMORPHONE HCL PF 1 MG/ML IJ SOLN
0.2500 mg | INTRAMUSCULAR | Status: DC | PRN
  Administered 2013-06-13 (×2): 0.25 mg via INTRAVENOUS

## 2013-06-13 MED ORDER — PROMETHAZINE HCL 25 MG/ML IJ SOLN: 6.2500 mg | INTRAMUSCULAR | Status: DC | PRN

## 2013-06-13 SURGICAL SUPPLY — 57 items
BAG SPEC THK2 15X12 ZIP CLS (MISCELLANEOUS) ×1
BAG ZIPLOCK 12X15 (MISCELLANEOUS) ×3 IMPLANT
BANDAGE ELASTIC 4 VELCRO ST LF (GAUZE/BANDAGES/DRESSINGS) ×2 IMPLANT
BANDAGE ELASTIC 6 VELCRO ST LF (GAUZE/BANDAGES/DRESSINGS) ×3 IMPLANT
BIT DRILL QC 2.7 6.3IN  SHORT (BIT) ×2
BIT DRILL QC 2.7 6.3IN SHORT (BIT) IMPLANT
COVER SURGICAL LIGHT HANDLE (MISCELLANEOUS) ×3 IMPLANT
CUFF TOURN SGL QUICK 34 (TOURNIQUET CUFF) ×3
CUFF TRNQT CYL 34X4X40X1 (TOURNIQUET CUFF) ×1 IMPLANT
DECANTER SPIKE VIAL GLASS SM (MISCELLANEOUS) ×1 IMPLANT
DRAPE C-ARM 42X120 X-RAY (DRAPES) ×3 IMPLANT
DRAPE C-ARMOR (DRAPES) ×2 IMPLANT
DRAPE U-SHAPE 47X51 STRL (DRAPES) ×3 IMPLANT
DRSG ADAPTIC 3X8 NADH LF (GAUZE/BANDAGES/DRESSINGS) ×3 IMPLANT
DRSG PAD ABDOMINAL 8X10 ST (GAUZE/BANDAGES/DRESSINGS) ×2 IMPLANT
DRSG TEGADERM 4X4.75 (GAUZE/BANDAGES/DRESSINGS) ×2 IMPLANT
DRSG XEROFORM 1X8 (GAUZE/BANDAGES/DRESSINGS) ×2 IMPLANT
ELECT REM PT RETURN 9FT ADLT (ELECTROSURGICAL) ×3
ELECTRODE REM PT RTRN 9FT ADLT (ELECTROSURGICAL) ×1 IMPLANT
GAUZE XEROFORM 1X8 LF (GAUZE/BANDAGES/DRESSINGS) ×6 IMPLANT
GAUZE XEROFORM 5X9 LF (GAUZE/BANDAGES/DRESSINGS) ×2 IMPLANT
GLOVE BIO SURGEON STRL SZ7.5 (GLOVE) ×3 IMPLANT
GLOVE BIOGEL PI IND STRL 8 (GLOVE) ×1 IMPLANT
GLOVE BIOGEL PI INDICATOR 8 (GLOVE) ×2
GLOVE ECLIPSE 8.0 STRL XLNG CF (GLOVE) ×3 IMPLANT
GOWN STRL REUS W/TWL XL LVL3 (GOWN DISPOSABLE) ×3 IMPLANT
K-WIRE 1.6 (WIRE) ×3
K-WIRE FX150X1.6XTROC PNT (WIRE) ×1
KWIRE FX150X1.6XTROC PNT (WIRE) ×1 IMPLANT
NEEDLE HYPO 22GX1.5 SAFETY (NEEDLE) ×3 IMPLANT
PACK LOWER EXTREMITY WL (CUSTOM PROCEDURE TRAY) ×3 IMPLANT
PAD ABD 8X10 STRL (GAUZE/BANDAGES/DRESSINGS) ×5 IMPLANT
PAD CAST 4YDX4 CTTN HI CHSV (CAST SUPPLIES) ×2 IMPLANT
PADDING CAST ABS 4INX4YD NS (CAST SUPPLIES) ×2
PADDING CAST ABS 6INX4YD NS (CAST SUPPLIES) ×2
PADDING CAST ABS COTTON 4X4 ST (CAST SUPPLIES) ×1 IMPLANT
PADDING CAST ABS COTTON 6X4 NS (CAST SUPPLIES) IMPLANT
PADDING CAST COTTON 4X4 STRL (CAST SUPPLIES) ×6
PLATE 4HOLE (Plate) ×2 IMPLANT
PLATE FIBULAR DISTAL 4H (Plate) ×2 IMPLANT
POSITIONER SURGICAL ARM (MISCELLANEOUS) ×3 IMPLANT
SCREW 3.5X32MM (Screw) ×2 IMPLANT
SCREW 5.0X12 (Screw) ×4 IMPLANT
SCREW LOCK 12X3.5XST PRLC (Screw) IMPLANT
SCREW LOCK 3.5X12 (Screw) ×9 IMPLANT
SCREW NL 3.5X14 (Screw) ×2 IMPLANT
SCREW NL 3.5X28 (Screw) ×3 IMPLANT
SCREW NLCK 16X3.5XST CORT PRLC (Screw) ×1 IMPLANT
SCREW NON LOCK 3.5X12 (Screw) ×6 IMPLANT
SCREW NONLOCK 3.5X16 (Screw) ×3 IMPLANT
SPLINT FIBERGLASS 5X30 (CAST SUPPLIES) ×2 IMPLANT
SPONGE GAUZE 4X4 12PLY (GAUZE/BANDAGES/DRESSINGS) ×3 IMPLANT
SUT ETHILON 4 0 PS 2 18 (SUTURE) ×6 IMPLANT
SUT VIC AB 2-0 CT1 27 (SUTURE) ×3
SUT VIC AB 2-0 CT1 TAPERPNT 27 (SUTURE) ×1 IMPLANT
SYR CONTROL 10ML LL (SYRINGE) ×3 IMPLANT
TOWEL OR 17X26 10 PK STRL BLUE (TOWEL DISPOSABLE) ×6 IMPLANT

## 2013-06-13 NOTE — Transfer of Care (Signed)
Immediate Anesthesia Transfer of Care Note  Patient: Jose LeydenKevin L Galvan  Procedure(s) Performed: Procedure(s): OPEN REDUCTION INTERNAL FIXATION (ORIF) LEFT ANKLE FRACTURE (Left)  Patient Location: PACU  Anesthesia Type:General  Level of Consciousness: awake and alert   Airway & Oxygen Therapy: Patient Spontanous Breathing and Patient connected to face mask oxygen  Post-op Assessment: Report given to PACU RN and Post -op Vital signs reviewed and stable  Post vital signs: Reviewed and stable  Complications: No apparent anesthesia complications

## 2013-06-13 NOTE — Anesthesia Postprocedure Evaluation (Signed)
Anesthesia Post Note  Patient: Jose LeydenKevin L Galvan  Procedure(s) Performed: Procedure(s) (LRB): OPEN REDUCTION INTERNAL FIXATION (ORIF) LEFT ANKLE FRACTURE (Left)  Anesthesia type: General  Patient location: PACU  Post pain: Pain level controlled  Post assessment: Post-op Vital signs reviewed  Last Vitals:  Filed Vitals:   06/13/13 1502  BP: 158/89  Pulse: 81  Temp: 37.1 C  Resp: 12    Post vital signs: Reviewed  Level of consciousness: sedated  Complications: No apparent anesthesia complications

## 2013-06-13 NOTE — Anesthesia Preprocedure Evaluation (Addendum)
Anesthesia Evaluation  Patient identified by MRN, date of birth, ID band Patient awake    Reviewed: Allergy & Precautions, H&P , NPO status , Patient's Chart, lab work & pertinent test results  Airway Mallampati: II TM Distance: >3 FB Neck ROM: Full    Dental  (+) Teeth Intact and Dental Advisory Given   Pulmonary neg pulmonary ROS, former smoker,  breath sounds clear to auscultation  Pulmonary exam normal       Cardiovascular negative cardio ROS  Rhythm:Regular Rate:Normal     Neuro/Psych Seizures -,  negative neurological ROS  negative psych ROS   GI/Hepatic negative GI ROS, Neg liver ROS,   Endo/Other  negative endocrine ROS  Renal/GU negative Renal ROS  negative genitourinary   Musculoskeletal negative musculoskeletal ROS (+)   Abdominal   Peds negative pediatric ROS (+)  Hematology negative hematology ROS (+)   Anesthesia Other Findings   Reproductive/Obstetrics                          Anesthesia Physical Anesthesia Plan  ASA: II  Anesthesia Plan: General   Post-op Pain Management:    Induction: Intravenous  Airway Management Planned: LMA  Additional Equipment:   Intra-op Plan:   Post-operative Plan: Extubation in OR  Informed Consent: I have reviewed the patients History and Physical, chart, labs and discussed the procedure including the risks, benefits and alternatives for the proposed anesthesia with the patient or authorized representative who has indicated his/her understanding and acceptance.   Dental advisory given  Plan Discussed with: CRNA  Anesthesia Plan Comments:         Anesthesia Quick Evaluation
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
## 2013-06-13 NOTE — Brief Op Note (Signed)
06/13/2013  1:25 PM  PATIENT:  Sharmon LeydenKevin L Christiano  33 y.o. male  PRE-OPERATIVE DIAGNOSIS:  Left bimalleolar ankle fracture  POST-OPERATIVE DIAGNOSIS:  Left bimalleolar ankle fracture  PROCEDURE:  Procedure(s): OPEN REDUCTION INTERNAL FIXATION (ORIF) LEFT ANKLE FRACTURE (Left)  SURGEON:  Surgeon(s) and Role:    * Kathryne Hitchhristopher Y Irene Mitcham, MD - Primary  PHYSICIAN ASSISTANT: Rexene EdisonGil Clark, PA-C  ANESTHESIA:   local and general  EBL:   50 cc  BLOOD ADMINISTERED:none  DRAINS: none   LOCAL MEDICATIONS USED:  MARCAINE     SPECIMEN:  No Specimen  DISPOSITION OF SPECIMEN:  N/A  COUNTS:  YES  TOURNIQUET:  * Missing tourniquet times found for documented tourniquets in log:  [id number] *  DICTATION: .Other Dictation: Dictation Number [id number]  PLAN OF CARE: Admit for overnight observation  PATIENT DISPOSITION:  PACU - hemodynamically stable.   Delay start of Pharmacological VTE agent (>24hrs) due to surgical blood loss or risk of bleeding: no

## 2013-06-14 MED ORDER — CYCLOBENZAPRINE HCL 10 MG PO TABS: 10.0000 mg | ORAL_TABLET | Freq: Three times a day (TID) | ORAL | Status: AC | PRN
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
## 2013-06-14 NOTE — Progress Notes (Signed)
Subjective: 1 Day Post-Op Procedure(s) (LRB): OPEN REDUCTION INTERNAL FIXATION (ORIF) LEFT ANKLE FRACTURE (Left) Patient reports pain as moderate.    Objective: Vital signs in last 24 hours: Temp:  [98.3 F (36.8 C)-98.9 F (37.2 C)] 98.5 F (36.9 C) (01/24 0500) Pulse Rate:  [73-96] 95 (01/24 0500) Resp:  [12-16] 16 (01/24 0500) BP: (145-175)/(72-92) 175/85 mmHg (01/24 0500) SpO2:  [100 %] 100 % (01/24 0500) Weight:  [68 kg (149 lb 14.6 oz)] 68 kg (149 lb 14.6 oz) (01/23 1611)  Intake/Output from previous day: 01/23 0701 - 01/24 0700 In: 2924.6 [P.O.:760; I.V.:2164.6] Out: 2475 [Urine:2475] Intake/Output this shift: Total I/O In: 240 [P.O.:240] Out: 400 [Urine:400]  No results found for this basename: HGB,  in the last 72 hours No results found for this basename: WBC, RBC, HCT, PLT,  in the last 72 hours No results found for this basename: NA, K, CL, CO2, BUN, CREATININE, GLUCOSE, CALCIUM,  in the last 72 hours No results found for this basename: LABPT, INR,  in the last 72 hours  Sensation intact distally Intact pulses distally Incision: dressing C/D/I  Assessment/Plan: 1 Day Post-Op Procedure(s) (LRB): OPEN REDUCTION INTERNAL FIXATION (ORIF) LEFT ANKLE FRACTURE (Left) Discharge to home today  Kathryne HitchBLACKMAN,Rae Sutcliffe Y 06/14/2013, 9:45 AM
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
## 2013-06-14 NOTE — Progress Notes (Signed)
Pt stable, scripts, d/c instructions given with no questions/concerns voiced by pt/sister.  Pt transported via wheelchair to private vehicle by NT and sister.

## 2013-06-16 ENCOUNTER — Other Ambulatory Visit (HOSPITAL_COMMUNITY): Payer: Self-pay | Admitting: Orthopaedic Surgery

## 2013-06-16 ENCOUNTER — Encounter (HOSPITAL_COMMUNITY): Payer: Self-pay | Admitting: Orthopaedic Surgery

## 2013-06-16 DIAGNOSIS — S82892A Other fracture of left lower leg, initial encounter for closed fracture: Secondary | ICD-10-CM
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
## 2014-05-27 IMAGING — RF DG ANKLE 2V *L*
1 series · 3 of 3 positions shown · non-contrast
Comparison: DG ANKLE COMPLETE*L* dated 06/07/2013

CLINICAL DATA: Left ankle fracture

EXAM:
LEFT ANKLE - 2 VIEW

[Series 1: run · 3 of 3 slices shown]
[im 1/3]
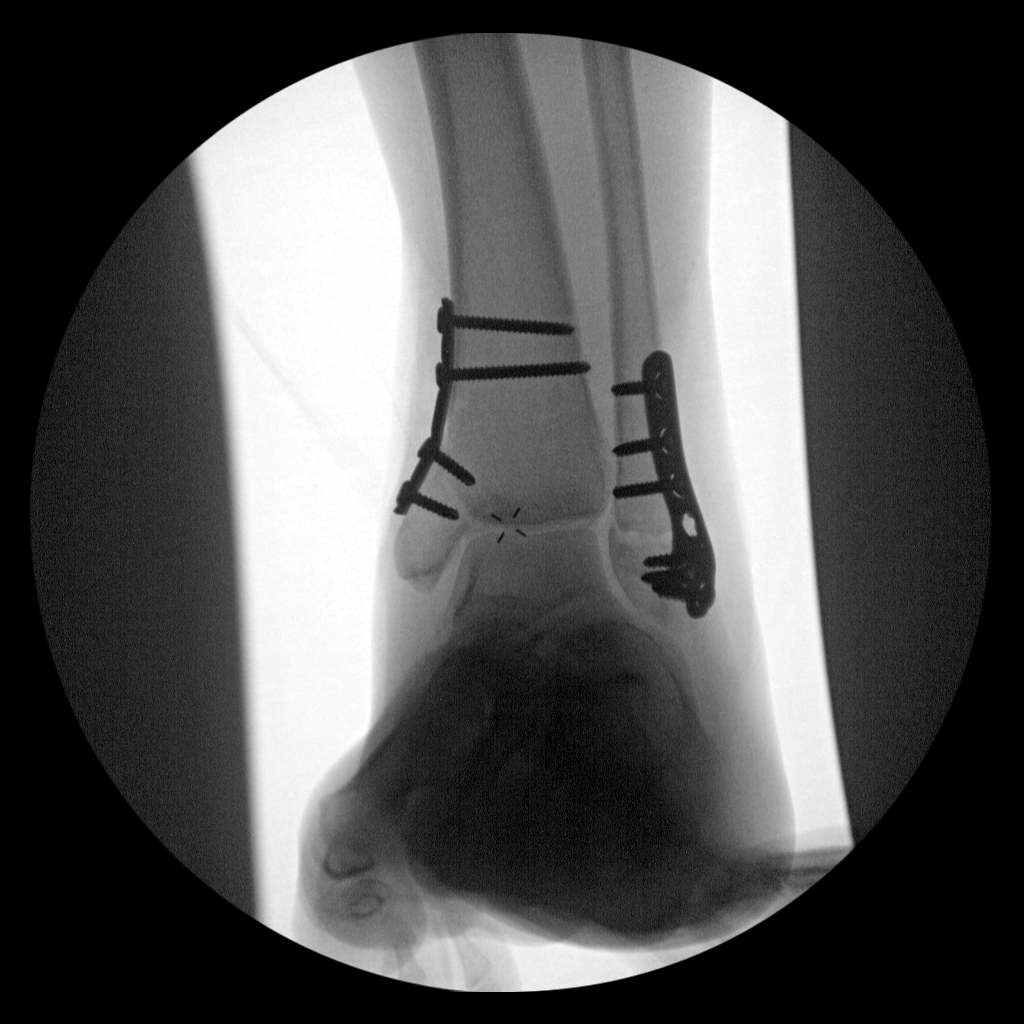
[im 2/3]
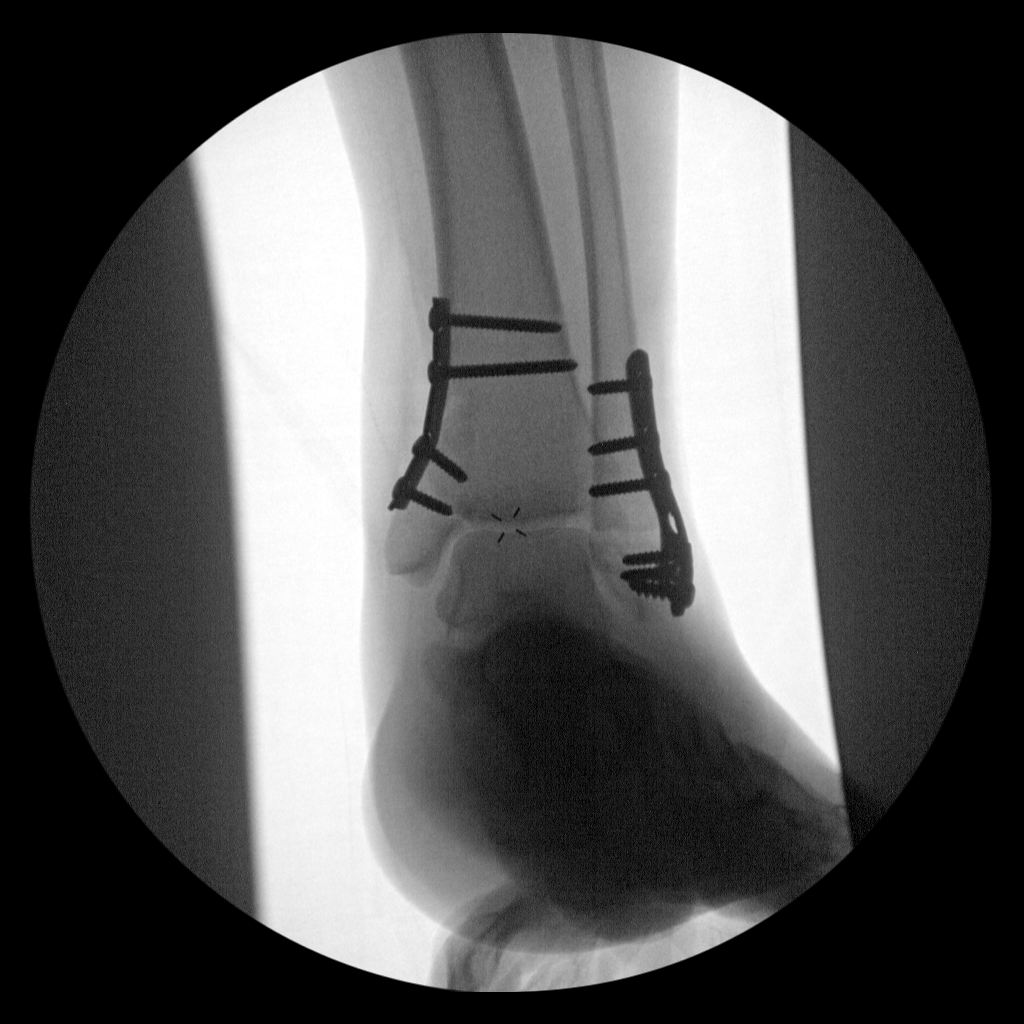
[im 3/3]
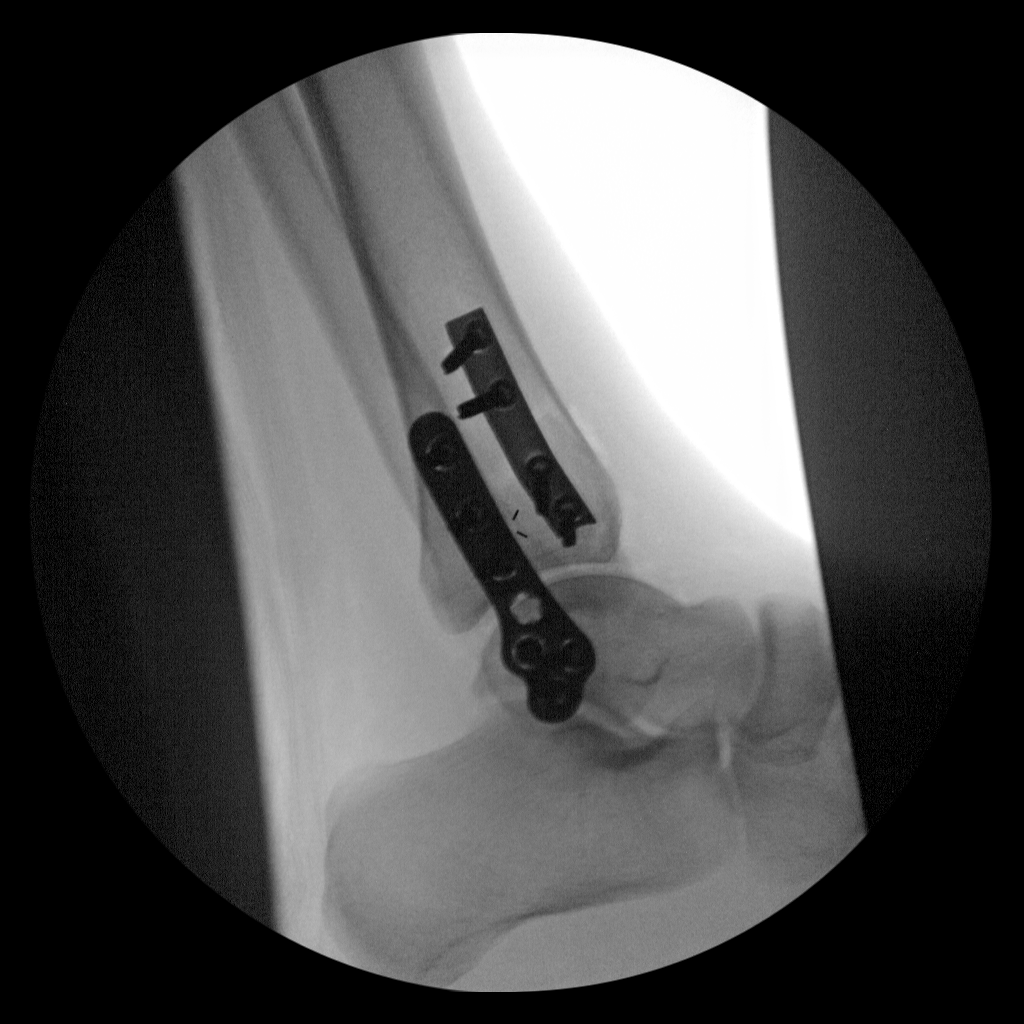

[3 of 3 positions shown; findings below may reference images not displayed]

FINDINGS: Three spot fluoroscopic images of the left ankle are provided for
review.

Images demonstrate the sequela of ORIF of previously identified
bimalleolar fracture - dislocation with side plates about the medial
and lateral malleoli. Alignment now appears near anatomic. The ankle
mortise is preserved. Expected adjacent soft tissue swelling. No
radiopaque foreign body.
IMPRESSION: Post ORIF of previously identified bimalleolar fracture -
dislocation without evidence of complication.

## 2016-01-04 ENCOUNTER — Emergency Department (HOSPITAL_COMMUNITY)
Admission: EM | Admit: 2016-01-04 | Discharge: 2016-01-04 | Disposition: A | Discharge status: Home / Self Care | Payer: Self-pay | Attending: Emergency Medicine | Admitting: Emergency Medicine

## 2016-01-04 ENCOUNTER — Encounter (HOSPITAL_COMMUNITY): Payer: Self-pay | Admitting: Emergency Medicine

## 2016-01-04 DIAGNOSIS — Z7982 Long term (current) use of aspirin: Secondary | ICD-10-CM | POA: Insufficient documentation

## 2016-01-04 DIAGNOSIS — K029 Dental caries, unspecified: Secondary | ICD-10-CM | POA: Insufficient documentation

## 2016-01-04 DIAGNOSIS — F17210 Nicotine dependence, cigarettes, uncomplicated: Secondary | ICD-10-CM | POA: Insufficient documentation

## 2016-01-04 MED ORDER — IBUPROFEN 600 MG PO TABS: 600.0000 mg | ORAL_TABLET | Freq: Four times a day (QID) | ORAL | 0 refills | Status: AC | PRN

## 2016-01-04 MED ORDER — IBUPROFEN 400 MG PO TABS
600.0000 mg | ORAL_TABLET | Freq: Once | ORAL | Status: AC
  Administered 2016-01-04: 600 mg via ORAL
  Filled 2016-01-04: qty 1

## 2016-01-04 MED ORDER — PENICILLIN V POTASSIUM 500 MG PO TABS: 500.0000 mg | ORAL_TABLET | Freq: Four times a day (QID) | ORAL | 0 refills | Status: AC

## 2016-01-04 NOTE — ED Notes (Signed)
Patient states has not contacted a dentist.   Patient states he did use ibuprofen last night and it helped, but hasn't taken any today.

## 2016-01-04 NOTE — ED Triage Notes (Signed)
Toothache started yesterday-- cavity on lower left molar. Has a heart murmer-- has to take PCN prior to any dental work.

## 2016-01-04 NOTE — ED Provider Notes (Signed)
[facility]-EMERGENCY DEPT Provider Note   CSN: [id number] Arrival date & time: 01/04/16  [id number]     History   Chief Complaint Chief Complaint  Patient presents with  . Dental Pain   HPI   Jose Galvan is an 35 y.o. male who presents to the ED for evaluation of dental pain. He reports pain to tooth #19 for the past day. He states he thinks his filling broke or fell out. Denies known trauma or injury. He has tried orajel and aspirin with minimal relief of the pain. Denies fever, chills, dysphagia, or trismus. States the pain is throbbing. He states he does not have a dentist. He states he is here because he wants antibiotics "before the infection."  Past Medical History:  Diagnosis Date  . Heart murmur    as a child  . Heartburn   . Seizures (HCC) 10/13   x 1- none since-   "stress related" no further follow up per pt hx    Patient Active Problem List   Diagnosis Date Noted  . Bimalleolar fracture of left ankle 06/13/2013  . Closed displaced bimalleolar fracture of left ankle 06/13/2013  . Abnormal ECG 10/20/2011    Past Surgical History:  Procedure Laterality Date  . HERNIA REPAIR     at [phone] years of age  . ORIF ANKLE FRACTURE Left 06/13/2013   Procedure: OPEN REDUCTION INTERNAL FIXATION (ORIF) LEFT ANKLE FRACTURE;  Surgeon: Kathryne Hitchhristopher Y Blackman, MD;  Location: [facility];  Service: Orthopedics;  Laterality: Left;       Home Medications    Prior to Admission medications   Medication Sig Start Date End Date Taking? Authorizing Provider  aspirin 325 MG tablet Take 1 tablet (325 mg total) by mouth 2 (two) times daily after a meal. 06/13/13   Kathryne Hitchhristopher Y Blackman, MD  cyclobenzaprine (FLEXERIL) 10 MG tablet Take 1 tablet (10 mg total) by mouth 3 (three) times daily as needed for muscle spasms. 06/14/13   Kathryne Hitchhristopher Y Blackman, MD  ibuprofen (ADVIL,MOTRIN) 600 MG tablet Take 1 tablet (600 mg total) by mouth every 6 (six) hours as needed. 01/04/16   Ace GinsSerena Y Kinaya Hilliker, PA-C    neomycin-bacitracin-polymyxin (NEOSPORIN) ointment Apply 1 application topically every 12 (twelve) hours. apply to eye 06/07/13   Derwood KaplanAnkit Nanavati, MD  oxyCODONE-acetaminophen (ROXICET) 5-325 MG per tablet Take 1-2 tablets by mouth every 4 (four) hours as needed for severe pain. 06/13/13   Kathryne Hitchhristopher Y Blackman, MD  penicillin v potassium (VEETID) 500 MG tablet Take 1 tablet (500 mg total) by mouth 4 (four) times daily. 01/04/16 01/11/16  Carlene CoriaSerena Y Codee Bloodworth, PA-C    Family History Family History  Problem Relation Age of Onset  . Heart attack Father 8950  . Coronary artery disease Father 4750    with stent  . Diabetes Mother   . Hypertension Mother   . Stroke Sister 4540  . Cancer Paternal Grandfather   . Cancer Maternal Grandmother   . Hypertension Sister   . Hypertension Sister   . Hypertension Sister     Social History Social History  Substance Use Topics  . Smoking status: Current Some Day Smoker    Packs/day: 0.50    Years: 13.00    Types: Cigarettes  . Smokeless tobacco: Never Used  . Alcohol use Yes     Comment: 212 pk week/ 2 cases in summer     Allergies   Review of patient's allergies indicates no known allergies.   Review of Systems Review of  Systems  All other systems reviewed and are negative.    Physical Exam Updated Vital Signs BP 159/97 (BP Location: Right Arm)   Pulse 62   Temp 98.3 F (36.8 C) (Oral)   Resp 16   Ht 5\' 6"  (1.676 m)   Wt 70.3 kg   SpO2 99%   BMI 25.02 kg/m   Physical Exam  Constitutional: He is oriented to person, place, and time. No distress.  HENT:  Head: Atraumatic.  Right Ear: External ear normal.  Left Ear: External ear normal.  Nose: Nose normal.  Generally poor dentition. Tooth #18 with large filling that appears somewhat loose. Tooth #19 is broken and largely decayed. There is some surrounding gingival inflammation though no gross abscess. Uvula midline. No trismus.   Eyes: Conjunctivae are normal. No scleral icterus.   Cardiovascular: Normal rate and regular rhythm.   Pulmonary/Chest: Effort normal. No respiratory distress.  Abdominal: He exhibits no distension.  Neurological: He is alert and oriented to person, place, and time.  Skin: Skin is warm and dry. He is not diaphoretic.  Psychiatric: He has a normal mood and affect. His behavior is normal.  Nursing note and vitals reviewed.    ED Treatments / Results  Labs (all labs ordered are listed, but only abnormal results are displayed) Labs Reviewed - No data to display  EKG  EKG Interpretation None       Radiology No results found.  Procedures Procedures (including critical care time)  Medications Ordered in ED Medications  ibuprofen (ADVIL,MOTRIN) tablet 600 mg (600 mg Oral Given 01/04/16 0844)     Initial Impression / Assessment and Plan / ED Course  I have reviewed the triage vital signs and the nursing notes.  Pertinent labs & imaging results that were available during my care of the patient were reviewed by me and considered in my medical decision making (see chart for details).  Clinical Course    Discussed with pt that ultimately he will need to see a dentist for definitive treatment. His pain is likely from the broken and decaying tooth #19. He has no gross abscess visible for drainage in the ED. No trismus or dysphagia. He is nontoxic appearing. Will initiate course of Pen VK and provide dental resource guide for dental follow up. ER return precautions given.  Final Clinical Impressions(s) / ED Diagnoses   Final diagnoses:  Pain due to dental caries    New Prescriptions New Prescriptions   IBUPROFEN (ADVIL,MOTRIN) 600 MG TABLET    Take 1 tablet (600 mg total) by mouth every 6 (six) hours as needed.   PENICILLIN V POTASSIUM (VEETID) 500 MG TABLET    Take 1 tablet (500 mg total) by mouth 4 (four) times daily.     Carlene CoriaSerena Y Dema Timmons, PA-C 01/04/16 [id number]    Gerhard Munchobert Lockwood, MD 01/05/16 [phone]

## 2016-01-04 NOTE — Discharge Instructions (Signed)
Please see the attached list for dental clinics to call in the area.

## 2016-06-05 ENCOUNTER — Ambulatory Visit: Payer: Self-pay | Admitting: Family Medicine

## 2016-06-13 ENCOUNTER — Ambulatory Visit: Payer: Self-pay | Attending: Family Medicine | Admitting: Family Medicine

## 2016-06-13 VITALS — BP 127/77 | HR 77 | Temp 98.2°F | Resp 18 | Ht 66.0 in | Wt 161.6 lb

## 2016-06-13 DIAGNOSIS — Z7982 Long term (current) use of aspirin: Secondary | ICD-10-CM | POA: Insufficient documentation

## 2016-06-13 DIAGNOSIS — Z8249 Family history of ischemic heart disease and other diseases of the circulatory system: Secondary | ICD-10-CM | POA: Insufficient documentation

## 2016-06-13 DIAGNOSIS — I10 Essential (primary) hypertension: Secondary | ICD-10-CM | POA: Insufficient documentation

## 2016-06-13 DIAGNOSIS — Z79899 Other long term (current) drug therapy: Secondary | ICD-10-CM | POA: Insufficient documentation

## 2016-06-13 LAB — BASIC METABOLIC PANEL WITH GFR
BUN: 10 mg/dL (ref 7–25)
CALCIUM: 10 mg/dL (ref 8.6–10.3)
CO2: 28 mmol/L (ref 20–31)
CREATININE: 0.93 mg/dL (ref 0.60–1.35)
Chloride: 100 mmol/L (ref 98–110)
GLUCOSE: 77 mg/dL (ref 65–99)
Potassium: 4 mmol/L (ref 3.5–5.3)
Sodium: 138 mmol/L (ref 135–146)

## 2016-06-13 MED ORDER — HYDROCHLOROTHIAZIDE 25 MG PO TABS: 25.0000 mg | ORAL_TABLET | Freq: Every day | ORAL | 2 refills | Status: AC

## 2016-06-13 NOTE — Progress Notes (Signed)
   Subjective:  Patient ID: Jose Galvan, male    DOB: 03/06/1981  Age: 36 y.o. MRN: [id number]  CC: Establish Care   HPI Jose Galvan presents to establish care for hypertension. He denies any CP, SOB, or BLE edema. He is a current smoker two "black and mild's" cigars 2 per day. He is not ready to quit at this time. He is taking HCTZ for his hypertension. He reports a family history of HTN, his mother and sisters.     Outpatient Medications Prior to Visit  Medication Sig Dispense Refill  . aspirin 325 MG tablet Take 1 tablet (325 mg total) by mouth 2 (two) times daily after a meal. (Patient not taking: Reported on 06/13/2016) 60 tablet 0  . cyclobenzaprine (FLEXERIL) 10 MG tablet Take 1 tablet (10 mg total) by mouth 3 (three) times daily as needed for muscle spasms. (Patient not taking: Reported on 06/13/2016) 60 tablet 0  . ibuprofen (ADVIL,MOTRIN) 600 MG tablet Take 1 tablet (600 mg total) by mouth every 6 (six) hours as needed. (Patient not taking: Reported on 06/13/2016) 30 tablet 0  . neomycin-bacitracin-polymyxin (NEOSPORIN) ointment Apply 1 application topically every 12 (twelve) hours. apply to eye (Patient not taking: Reported on 06/13/2016) 15 g 0  . oxyCODONE-acetaminophen (ROXICET) 5-325 MG per tablet Take 1-2 tablets by mouth every 4 (four) hours as needed for severe pain. (Patient not taking: Reported on 06/13/2016) 60 tablet 0   No facility-administered medications prior to visit.     ROS Review of Systems  Eyes: Negative.   Respiratory: Negative.   Cardiovascular: Negative.   Gastrointestinal: Negative.     Objective:  BP 127/77 (BP Location: Left Arm, Patient Position: Sitting, Cuff Size: Normal)   Pulse 77   Temp 98.2 F (36.8 C) (Oral)   Resp 18   Ht 5\' 6"  (1.676 m)   Wt 161 lb 9.6 oz (73.3 kg)   SpO2 100%   BMI 26.08 kg/m   BP/Weight 06/13/2016 01/04/2016 06/14/2013  Systolic BP 127 159 175  Diastolic BP 77 97 85  Wt. (Lbs) 161.6 155 -  BMI 26.08 25.02 -     Physical Exam  Eyes: Pupils are equal, round, and reactive to light.  Neck: No JVD present.  Cardiovascular: Normal rate, regular rhythm, normal heart sounds and intact distal pulses.   Pulmonary/Chest: Effort normal and breath sounds normal.  Abdominal: Soft. Bowel sounds are normal.  Nursing note and vitals reviewed.   Assessment & Plan:   Problem List Items Addressed This Visit    None    Visit Diagnoses    Essential hypertension    -  Primary   Relevant Medications   hydrochlorothiazide (HYDRODIURIL) 25 MG tablet   Other Relevant Orders   BASIC METABOLIC PANEL WITH GFR (Completed)   Microalbumin / creatinine urine ratio (Completed)      Meds ordered this encounter  Medications  . hydrochlorothiazide (HYDRODIURIL) 25 MG tablet    Sig: Take 1 tablet (25 mg total) by mouth daily.    Dispense:  30 tablet    Refill:  2    Order Specific Question:   Supervising Provider    Answer:   Quentin AngstJEGEDE, OLUGBEMIGA E [id number]]    Follow-up: Return in about 3 months (around 09/11/2016) for Hypertension.   Lizbeth BarkMandesia R Deontaye Civello FNP

## 2016-06-13 NOTE — Progress Notes (Signed)
Patient is here for High PB  Patient has not taken his meds today  Patient already eaten today  Patient declined the flu shot today

## 2016-06-13 NOTE — Patient Instructions (Signed)
Hypertension Hypertension is another name for high blood pressure. High blood pressure forces your heart to work harder to pump blood. A blood pressure reading has two numbers, which includes a higher number over a lower number (example: 110/72). Follow these instructions at home:  Have your blood pressure rechecked by your doctor.  Only take medicine as told by your doctor. Follow the directions carefully. The medicine does not work as well if you skip doses. Skipping doses also puts you at risk for problems.  Do not smoke.  Monitor your blood pressure at home as told by your doctor. Contact a doctor if:  You think you are having a reaction to the medicine you are taking.  You have repeat headaches or feel dizzy.  You have puffiness (swelling) in your ankles.  You have trouble with your vision. Get help right away if:  You get a very bad headache and are confused.  You feel weak, numb, or faint.  You get chest or belly (abdominal) pain.  You throw up (vomit).  You cannot breathe very well. This information is not intended to replace advice given to you by your health care provider. Make sure you discuss any questions you have with your health care provider. Document Released: 10/25/2007 Document Revised: 10/14/2015 Document Reviewed: 02/28/2013 Elsevier Interactive Patient Education  2017 [employer].  

## 2016-06-14 LAB — MICROALBUMIN / CREATININE URINE RATIO
Creatinine, Urine: 82 mg/dL (ref 20–370)
MICROALB/CREAT RATIO: 4 ug/mg{creat} (ref <30)
Microalb, Ur: 0.3 mg/dL

## 2016-06-15 ENCOUNTER — Telehealth: Payer: Self-pay

## 2016-06-15 NOTE — Telephone Encounter (Signed)
CMA call to go over his lab results. I spoke with his sister and told her the reason of the call is to go over his results with him and to call us back

## 2016-06-15 NOTE — Telephone Encounter (Signed)
-----   Message from Lizbeth BarkMandesia R Hairston, FNP sent at 06/15/2016  9:30 AM EST ----- Microalbumin/creatinine ratio level was normal. This tests for protein in your urine that can indicate early signs of kidney damage.  Labs normal Kidney function normal

## 2016-06-28 ENCOUNTER — Encounter (HOSPITAL_COMMUNITY): Payer: Self-pay | Admitting: *Deleted

## 2016-06-28 ENCOUNTER — Emergency Department (HOSPITAL_COMMUNITY)
Admission: EM | Admit: 2016-06-28 | Discharge: 2016-06-28 | Disposition: A | Discharge status: Home / Self Care | Payer: Self-pay | Attending: Emergency Medicine | Admitting: Emergency Medicine

## 2016-06-28 DIAGNOSIS — Z7982 Long term (current) use of aspirin: Secondary | ICD-10-CM | POA: Insufficient documentation

## 2016-06-28 DIAGNOSIS — M545 Low back pain, unspecified: Secondary | ICD-10-CM

## 2016-06-28 DIAGNOSIS — F17210 Nicotine dependence, cigarettes, uncomplicated: Secondary | ICD-10-CM | POA: Insufficient documentation

## 2016-06-28 DIAGNOSIS — Y9269 Other specified industrial and construction area as the place of occurrence of the external cause: Secondary | ICD-10-CM | POA: Insufficient documentation

## 2016-06-28 DIAGNOSIS — Y9389 Activity, other specified: Secondary | ICD-10-CM | POA: Insufficient documentation

## 2016-06-28 DIAGNOSIS — Y990 Civilian activity done for income or pay: Secondary | ICD-10-CM | POA: Insufficient documentation

## 2016-06-28 DIAGNOSIS — X500XXA Overexertion from strenuous movement or load, initial encounter: Secondary | ICD-10-CM | POA: Insufficient documentation

## 2016-06-28 MED ORDER — PREDNISONE 20 MG PO TABS: ORAL_TABLET | ORAL | 0 refills | Status: AC

## 2016-06-28 MED ORDER — MELOXICAM 15 MG PO TABS: 15.0000 mg | ORAL_TABLET | Freq: Every day | ORAL | 0 refills | Status: AC

## 2016-06-28 MED ORDER — BACLOFEN 10 MG PO TABS: 10.0000 mg | ORAL_TABLET | Freq: Three times a day (TID) | ORAL | 0 refills | Status: AC

## 2016-06-28 NOTE — Discharge Instructions (Signed)
SEEK IMMEDIATE MEDICAL ATTENTION IF: New numbness, tingling, weakness, or problem with the use of your arms or legs.  Severe back pain not relieved with medications.  Change in bowel or bladder control.  Increasing pain in any areas of the body (such as chest or abdominal pain).  Shortness of breath, dizziness or fainting.  Nausea (feeling sick to your stomach), vomiting, fever, or sweats.  

## 2016-06-28 NOTE — ED Triage Notes (Signed)
Pt rpeort back pain for several days. Pt states that he has hx of lower back pain. Pt unsure of cause. Pt denies urinary symptoms.

## 2016-06-28 NOTE — ED Notes (Signed)
Papers reviewed and he verbalizes understanding

## 2016-06-28 NOTE — ED Provider Notes (Signed)
[facility]-EMERGENCY DEPT Provider Note   CSN: [id number] Arrival date & time: 06/28/16  [id number]  By signing my name below, I, Jose Galvan, attest that this documentation has been prepared under the direction and in the presence of Arthor CaptainAbigail Kyrah Schiro, PA-C. Electronically Signed: Freida Busmaniana Galvan, Scribe. 06/28/2016. 12:54 PM.   History   Chief Complaint Chief Complaint  Patient presents with  . Back Pain     The history is provided by the patient. No language interpreter was used.     HPI Comments:  Jose Galvan is a 36 y.o. male who presents to the Emergency Department complaining of moderate pain to the lower back since yesterday. His pain is worse when seated upright. Pt reports radiation of pain down the back of the entire RLE . He has a h/o similar pain after heavy lifting at work which resolved on its own. He still works in [employer] and continues to lift regularly. He denies weakness in his BLE, and bowel/bladder incontinence. He has taken ibuprofen with minimal relief.   Past Medical History:  Diagnosis Date  . Heart murmur    as a child  . Heartburn   . Seizures (HCC) 10/13   x 1- none since-   "stress related" no further follow up per pt hx    Patient Active Problem List   Diagnosis Date Noted  . Bimalleolar fracture of left ankle 06/13/2013  . Closed displaced bimalleolar fracture of left ankle 06/13/2013  . Abnormal ECG 10/20/2011    Past Surgical History:  Procedure Laterality Date  . HERNIA REPAIR     at [phone] years of age  . ORIF ANKLE FRACTURE Left 06/13/2013   Procedure: OPEN REDUCTION INTERNAL FIXATION (ORIF) LEFT ANKLE FRACTURE;  Surgeon: Kathryne Hitchhristopher Y Blackman, MD;  Location: [facility];  Service: Orthopedics;  Laterality: Left;       Home Medications    Prior to Admission medications   Medication Sig Start Date End Date Taking? Authorizing Provider  aspirin 325 MG tablet Take 1 tablet (325 mg total) by mouth 2 (two) times daily after a meal. Patient not  taking: Reported on 06/13/2016 06/13/13   Kathryne Hitchhristopher Y Blackman, MD  baclofen (LIORESAL) 10 MG tablet Take 1 tablet (10 mg total) by mouth 3 (three) times daily. 06/28/16   Arthor CaptainAbigail Jarmaine Ehrler, PA-C  cyclobenzaprine (FLEXERIL) 10 MG tablet Take 1 tablet (10 mg total) by mouth 3 (three) times daily as needed for muscle spasms. Patient not taking: Reported on 06/13/2016 06/14/13   Kathryne Hitchhristopher Y Blackman, MD  hydrochlorothiazide (HYDRODIURIL) 25 MG tablet Take 25 mg by mouth daily.    Historical Provider, MD  hydrochlorothiazide (HYDRODIURIL) 25 MG tablet Take 1 tablet (25 mg total) by mouth daily. 06/13/16   Lizbeth BarkMandesia R Hairston, FNP  ibuprofen (ADVIL,MOTRIN) 600 MG tablet Take 1 tablet (600 mg total) by mouth every 6 (six) hours as needed. Patient not taking: Reported on 06/13/2016 01/04/16   Ace GinsSerena Y Sam, PA-C  meloxicam (MOBIC) 15 MG tablet Take 1 tablet (15 mg total) by mouth daily. Take 1 daily with food. 06/28/16   Arthor CaptainAbigail Kayo Zion, PA-C  neomycin-bacitracin-polymyxin (NEOSPORIN) ointment Apply 1 application topically every 12 (twelve) hours. apply to eye Patient not taking: Reported on 06/13/2016 06/07/13   Derwood KaplanAnkit Nanavati, MD  oxyCODONE-acetaminophen (ROXICET) 5-325 MG per tablet Take 1-2 tablets by mouth every 4 (four) hours as needed for severe pain. Patient not taking: Reported on 06/13/2016 06/13/13   Kathryne Hitchhristopher Y Blackman, MD  predniSONE (DELTASONE) 20 MG tablet 3 tabs  po daily x 3 days, then 2 tabs x 3 days, then 1.5 tabs x 3 days, then 1 tab x 3 days, then 0.5 tabs x 3 days 06/28/16   Arthor Captain, PA-C    Family History Family History  Problem Relation Age of Onset  . Heart attack Father 98  . Coronary artery disease Father 81    with stent  . Diabetes Mother   . Hypertension Mother   . Stroke Sister 62  . Cancer Paternal Grandfather   . Cancer Maternal Grandmother   . Hypertension Sister   . Hypertension Sister   . Hypertension Sister     Social History Social History  Substance Use Topics   . Smoking status: Current Some Day Smoker    Packs/day: 0.50    Years: 13.00    Types: Cigarettes  . Smokeless tobacco: Never Used  . Alcohol use Yes     Comment: 212 pk week/ 2 cases in summer     Allergies   Patient has no known allergies.   Review of Systems Review of Systems  Musculoskeletal: Positive for back pain and myalgias.  Neurological: Negative for weakness and numbness.   Physical Exam Updated Vital Signs BP 129/76   Pulse (!) 56   Temp 98.3 F (36.8 C) (Oral)   Resp 17   Ht 5\' 6"  (1.676 m)   Wt 73 kg   SpO2 100%   BMI 25.99 kg/m   Physical Exam  Constitutional: He is oriented to person, place, and time. He appears well-developed and well-nourished. No distress.  HENT:  Head: Normocephalic.  Eyes: Conjunctivae are normal.  Neck: Normal range of motion. Neck supple.  Cardiovascular: Normal rate.   Pulmonary/Chest: Effort normal.  Abdominal: He exhibits no distension.  Musculoskeletal: Normal range of motion. He exhibits tenderness.  No midline tenderness Tenderness to the right paraspinal muscle and right gluteus  Negative SLR  Neurological: He is alert and oriented to person, place, and time.  Skin: Skin is warm and dry.  Psychiatric: He has a normal mood and affect.  Nursing note and vitals reviewed.    ED Treatments / Results  DIAGNOSTIC STUDIES:  Oxygen Saturation is 100% on RA, normal by my interpretation.    COORDINATION OF CARE:  12:52 PM Discussed treatment plan with pt at bedside and pt agreed to plan.  Labs (all labs ordered are listed, but only abnormal results are displayed) Labs Reviewed - No data to display  EKG  EKG Interpretation None       Radiology No results found.  Procedures Procedures (including critical care time)  Medications Ordered in ED Medications - No data to display   Initial Impression / Assessment and Plan / ED Course  I have reviewed the triage vital signs and the nursing  notes.  Pertinent labs & imaging results that were available during my care of the patient were reviewed by me and considered in my medical decision making (see chart for details).       Patient with back pain.  No neurological deficits and normal neuro exam.  Patient is ambulatory.  No loss of bowel or bladder control.  No concern for cauda equina.  No fever, night sweats, weight loss, h/o cancer, IVDA, no recent procedure to back. No urinary symptoms suggestive of UTI.  Supportive care and return precaution discussed. Appears safe for discharge at this time. Follow up as indicated in discharge paperwork.   Final Clinical Impressions(s) / ED Diagnoses   Final  diagnoses:  Acute low back pain without sciatica, unspecified back pain laterality    New Prescriptions Discharge Medication List as of 06/28/2016  1:41 PM    START taking these medications   Details  baclofen (LIORESAL) 10 MG tablet Take 1 tablet (10 mg total) by mouth 3 (three) times daily., Starting Wed 06/28/2016, Print    meloxicam (MOBIC) 15 MG tablet Take 1 tablet (15 mg total) by mouth daily. Take 1 daily with food., Starting Wed 06/28/2016, Print    predniSONE (DELTASONE) 20 MG tablet 3 tabs po daily x 3 days, then 2 tabs x 3 days, then 1.5 tabs x 3 days, then 1 tab x 3 days, then 0.5 tabs x 3 days, Print       I personally performed the services described in this documentation, which was scribed in my presence. The recorded information has been reviewed and is accurate.       Arthor Captain, PA-C 07/02/16 2029    Lyndal Pulley, MD 07/03/16 [phone]

## 2024-01-29 ENCOUNTER — Emergency Department (HOSPITAL_COMMUNITY)
Admission: EM | Admit: 2024-01-29 | Discharge: 2024-01-30 | Discharge status: Against Medical Advice | Payer: Self-pay | Source: Ambulatory Visit | Attending: Physician Assistant | Admitting: Physician Assistant

## 2024-01-29 ENCOUNTER — Encounter (HOSPITAL_COMMUNITY): Payer: Self-pay

## 2024-01-29 ENCOUNTER — Other Ambulatory Visit: Payer: Self-pay

## 2024-01-29 ENCOUNTER — Emergency Department (HOSPITAL_COMMUNITY): Payer: Self-pay

## 2024-01-29 DIAGNOSIS — M25531 Pain in right wrist: Secondary | ICD-10-CM | POA: Insufficient documentation

## 2024-01-29 DIAGNOSIS — Z5321 Procedure and treatment not carried out due to patient leaving prior to being seen by health care provider: Secondary | ICD-10-CM | POA: Insufficient documentation

## 2024-01-29 NOTE — ED Triage Notes (Signed)
 Patient is c/o wrist injury on Friday, states that [facility] said it was too swollen for them to get an xray.

## 2024-01-30 NOTE — ED Notes (Signed)
Pt called x2 for vitals, no response.
